# Patient Record
Sex: Female | Born: 1955
Health system: Southern US, Community
[De-identification: ages and names within clinical notes are randomized; demographics above are authoritative.]

## PROBLEM LIST (undated history)

## (undated) DIAGNOSIS — M069 Rheumatoid arthritis, unspecified: Secondary | ICD-10-CM

## (undated) DIAGNOSIS — D509 Iron deficiency anemia, unspecified: Secondary | ICD-10-CM

## (undated) DIAGNOSIS — G5601 Carpal tunnel syndrome, right upper limb: Secondary | ICD-10-CM

## (undated) DIAGNOSIS — Z9289 Personal history of other medical treatment: Secondary | ICD-10-CM

## (undated) HISTORY — PX: TONSILLECTOMY: SUR1361

## (undated) HISTORY — PX: UPPER GI ENDOSCOPY: SHX6162

## (undated) HISTORY — PX: COLONOSCOPY: SHX174

## (undated) HISTORY — PX: TOTAL KNEE ARTHROPLASTY: SHX125

---

## 1990-04-25 HISTORY — PX: CARPAL TUNNEL RELEASE: SHX101

## 2019-02-24 HISTORY — PX: TOTAL HIP ARTHROPLASTY: SHX124

## 2020-01-01 ENCOUNTER — Ambulatory Visit (INDEPENDENT_AMBULATORY_CARE_PROVIDER_SITE_OTHER): Payer: 59

## 2020-01-01 ENCOUNTER — Ambulatory Visit: Payer: 59 | Admitting: Orthopaedic Surgery

## 2020-01-01 ENCOUNTER — Encounter: Payer: Self-pay | Admitting: Orthopaedic Surgery

## 2020-01-01 DIAGNOSIS — M25552 Pain in left hip: Secondary | ICD-10-CM

## 2020-01-01 DIAGNOSIS — M1612 Unilateral primary osteoarthritis, left hip: Secondary | ICD-10-CM

## 2020-01-01 NOTE — Progress Notes (Signed)
Office Visit Note   Patient: [name]           Date of Birth: 03/31/1956           MRN: [id number] Visit Date: 01/01/2020              Requested by: No referring provider defined for this encounter. PCP: Patient, No Pcp Per   Assessment & Plan: Visit Diagnoses:  1. Pain in left hip   2. Unilateral primary osteoarthritis, left hip     Plan: I agree with her need for a left total hip arthroplasty given the severity of her pain and the severity of her arthritic findings of her left hip on plain films.  I explained in detail what hip replacement surgery involves.  She is fully aware of this having had this performed on her right hip.  I explained the difference between the anterior and posterior approach.  I gave her handout about the surgery.  We had a long thorough discussion about her interoperative and postoperative course as well as the risk and benefits of surgery.  We will work on getting this scheduled in the near future.  All questions and concerns were answered and addressed.  Follow-Up Instructions: Return for 2 weeks post-op.   Orders:  Orders Placed This Encounter  Procedures  . XR HIP UNILAT W OR W/O PELVIS 2-3 VIEWS LEFT   No orders of the defined types were placed in this encounter.     Procedures: No procedures performed   Clinical Data: No additional findings.   Subjective: Chief Complaint  Patient presents with  . Left Hip - Pain  Patient comes in today with worsening left hip pain.  She actually was scheduled to undergo a hip replacement of her left hip in [place] but canceled that after moving to [place].  Her family is down here.  She does have a history of a right total hip arthroplasty done in [place] in August of this past year and she also has a right total knee arthroplasty.  Her left hip pain is debilitating.  It is daily.  It is 10 out of 10.  At this point her left hip pain is detrimentally affecting her mobility, her quality  of life and her actives daily living.  She wishes to go ahead and have her left hip replaced.  She has tried and failed conservative treatment for multiple years now including activity modification, anti-inflammatories, steroid injections, hip exercises and using an assistive device when she ambulates.  She denies any acute changes in her medical status.  HPI  Review of Systems She currently denies any headache, chest pain, shortness of breath, fever, chills, nausea, vomiting  Objective: Vital Signs: There were no vitals taken for this visit.  Physical Exam She is alert and orient x3 and in no acute distress.  She has a Trendelenburg gait favoring the left side. Ortho Exam Examination of her left hip shows essentially almost no internal and external rotation.  She has a leg length discrepancy with her left side shorter than left.  She has severe pain when I attempt to rotate her left hip. Specialty Comments:  No specialty comments available.  Imaging: XR HIP UNILAT W OR W/O PELVIS 2-3 VIEWS LEFT  Result Date: 01/01/2020 A low AP pelvis and lateral left hip shows severe end-stage arthritis with of her left hip.  There is essentially no joint space remaining.  There is superior migration of the femoral head.  There is significant cystic changes of the acetabulum and femoral head and flattening of the femoral head.  There are para-articular osteophytes as well.  There is a well-seated right total hip arthroplasty.    PMFS History: Patient Active Problem List   Diagnosis Date Noted  . Unilateral primary osteoarthritis, left hip 01/01/2020   History reviewed. No pertinent past medical history.  History reviewed. No pertinent family history.  History reviewed. No pertinent surgical history. Social History   Occupational History  . Not on file  Tobacco Use  . Smoking status: Not on file  Substance and Sexual Activity  . Alcohol use: Not on file  . Drug use: Not on file  . Sexual  activity: Not on file

## 2020-01-04 ENCOUNTER — Ambulatory Visit: Payer: Self-pay | Admitting: Family Medicine

## 2020-01-04 ENCOUNTER — Other Ambulatory Visit: Payer: Self-pay

## 2020-01-04 ENCOUNTER — Encounter: Payer: Self-pay | Admitting: Family Medicine

## 2020-01-04 ENCOUNTER — Ambulatory Visit: Payer: 59 | Admitting: Family Medicine

## 2020-01-04 DIAGNOSIS — M069 Rheumatoid arthritis, unspecified: Secondary | ICD-10-CM | POA: Diagnosis not present

## 2020-01-04 NOTE — Progress Notes (Signed)
Office Visit Note   Patient: [name]           Date of Birth: 1955/12/17           MRN: [id number] Visit Date: 01/04/2020 Requested by: No referring provider defined for this encounter. PCP: Patient, No Pcp Per  Subjective: No chief complaint on file.   HPI: She is here to establish care.  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  She has another daughter in [place] who works as a Designer, jewellery in a pain clinic.  Patient has a history of rheumatoid arthritis.  She is status post right knee and right hip replacement.  Her left hip has bothered her substantially, and she is scheduled for a hip replacement at the end of July.  From a rheumatoid arthritis standpoint she is on methotrexate.  She will be off medication for 1 month prior to surgery.  She has scheduled an appointment to establish with a rheumatologist in this area.  Her hands have been involved in the past, but are much less symptomatic now.  Her nurse practitioner daughter would like for her to establish with a cardiologist.  Patient is asymptomatic from a cardiology standpoint, her blood pressure has been normal, there is no family history of heart disease.  She is a former smoker more than 30 years ago.  She recently had labs drawn in [place] prior to moving here.  Everything was looking good per her report.  She is up-to-date on mammogram, colonoscopy and immunizations.                ROS:   All other systems were reviewed and are negative.  Objective: Vital Signs: BP 120/67 (BP Location: Left Arm, Patient Position: Sitting)   Pulse 82   Ht 5' 4.5" (1.638 m)   Wt 119 lb 9.6 oz (54.3 kg)   BMI 20.21 kg/m   Physical Exam:  General:  Alert and oriented, in no acute distress. Pulm:  Breathing unlabored. Psy:  Normal mood, congruent affect. Skin: No rash HEENT: No thyromegaly, no  carotid bruits. CV: Regular rate and rhythm without murmurs, rubs, or gallops.  No peripheral edema.  2+ radial and posterior tibial pulses. Lungs: Clear to auscultation throughout with no wheezing or areas of consolidation. Abd: Bowel sounds are active, no hepatosplenomegaly or masses.  Soft and nontender.  No audible bruits.  No evidence of ascites. Extremities: Very limited left hip range of motion.   Imaging: No results found.  Assessment & Plan: 1.  Visit to establish care -She does not need any medication refills right now.  She is taking adequate vitamins over-the-counter.  I will plan on seeing her back in about 6 months for routine visit with labs.  She will contact me prior to that if she needs anything. -She is cleared for upcoming hip replacement surgery.  2.  Rheumatoid arthritis -Getting ready to meet with rheumatologist in the near future. -After surgery, she would like to establish with a cardiologist for routine monitoring.     Procedures: No procedures performed  No notes on file     PMFS History: Patient Active Problem List   Diagnosis Date Noted  . Rheumatoid arthritis ([place]) 01/04/2020  . Unilateral primary osteoarthritis, left hip 01/01/2020   History reviewed. No pertinent past medical history.  Family History  Problem Relation Age of  Onset  . Liver cancer Mother   . Cancer Mother   . Rheumatologic disease Mother   . Prostate cancer Father        Asbestos exposure  . Cancer Father   . Colon cancer Father   . Breast cancer Neg Hx   . Heart disease Neg Hx     History reviewed. No pertinent surgical history. Social History   Occupational History  . Not on file  Tobacco Use  . Smoking status: Former Games developer  . Smokeless tobacco: Never Used  Substance and Sexual Activity  . Alcohol use: Not on file  . Drug use: Not on file  . Sexual activity: Not on file

## 2020-01-09 ENCOUNTER — Other Ambulatory Visit: Payer: Self-pay

## 2020-02-07 ENCOUNTER — Encounter (HOSPITAL_COMMUNITY): Payer: Self-pay

## 2020-02-07 NOTE — Patient Instructions (Addendum)
DUE TO COVID-19 ONLY ONE VISITOR ARE ALLOWED TO COME WITH YOU AND STAY IN THE WAITING ROOM ONLY DURING PRE OP AND PROCEDURE. THEN TWO VISITORS MAY VISIT WITH YOU IN YOUR PRIVATE ROOM DURING VISITING HOURS ONLY!! (10AM-8PM)   COVID SWAB TESTING MUST BE COMPLETED ON: Tuesday, February 19, 2020   at 10:15 AM  [address] [place] [facility] enter pre surgical testing line (Must self quarantine after testing. Follow instructions on handout.)             Your procedure is scheduled on: Friday, February 22, 2020   Report to [facility] Main  Entrance    Report to admitting at 7:15 AM   Call this number if you have problems the morning of surgery [phone]   Do not eat food :After Midnight.   May have liquids until  6:45 AM  day of surgery   CLEAR LIQUID DIET  Foods Allowed                                                                     Foods Excluded  Water, Black Coffee and tea, regular and decaf                             liquids that you cannot  Plain Jell-O in any flavor  (No red)                                           see through such as: Fruit ices (not with fruit pulp)                                     milk, soups, orange juice  Iced Popsicles (No red)                                    All solid food                                   Apple juices Sports drinks like Gatorade (No red) Lightly seasoned clear broth or consume(fat free) Sugar, honey syrup  Sample Menu Breakfast                                Lunch                                     Supper Cranberry juice                    Beef broth                            Chicken broth Jell-O  Grape juice                           Apple juice Coffee or tea                        Jell-O                                      Popsicle                                                Coffee or tea                        Coffee or tea   Oral Hygiene is  also important to reduce your risk of infection.                                    Remember - BRUSH YOUR TEETH THE MORNING OF SURGERY WITH YOUR REGULAR TOOTHPASTE   Do NOT smoke after Midnight   Take these medicines the morning of surgery with A SIP OF WATER: NONE                               You may not have any metal on your body including hair pins, jewelry, and body piercings             Do not wear make-up, lotions, powders, perfumes/cologne, or deodorant             Do not wear nail polish.  Do not shave  48 hours prior to surgery.              Do not bring valuables to the hospital. [facility] IS NOT             RESPONSIBLE   FOR VALUABLES.   Contacts, dentures or bridgework may not be worn into surgery.   Bring small overnight bag day of surgery.    Special Instructions: Bring a copy of your healthcare power of attorney and living will documents         the day of surgery if you haven't scanned them in before.              Please read over the following fact sheets you were given: IF YOU HAVE QUESTIONS ABOUT YOUR PRE OP INSTRUCTIONS PLEASE CALL [phone]   [facility] - Preparing for Surgery Before surgery, you can play an important role.  Because skin is not sterile, your skin needs to be as free of germs as possible.  You can reduce the number of germs on your skin by washing with CHG (chlorahexidine gluconate) soap before surgery.  CHG is an antiseptic cleaner which kills germs and bonds with the skin to continue killing germs even after washing. Please DO NOT use if you have an allergy to CHG or antibacterial soaps.  If your skin becomes reddened/irritated stop using the CHG and inform your nurse when you arrive at Short Stay. Do not shave (including legs and underarms) for at  least 48 hours prior to the first CHG shower.  You may shave your face/neck.  Please follow these instructions carefully:  1.  Shower with CHG Soap the night before surgery and the  morning of  surgery.  2.  If you choose to wash your hair, wash your hair first as usual with your normal  shampoo.  3.  After you shampoo, rinse your hair and body thoroughly to remove the shampoo.                             4.  Use CHG as you would any other liquid soap.  You can apply chg directly to the skin and wash.  Gently with a scrungie or clean washcloth.  5.  Apply the CHG Soap to your body ONLY FROM THE NECK DOWN.   Do   not use on face/ open                           Wound or open sores. Avoid contact with eyes, ears mouth and   genitals (private parts).                       Wash face,  Genitals (private parts) with your normal soap.             6.  Wash thoroughly, paying special attention to the area where your    surgery  will be performed.  7.  Thoroughly rinse your body with warm water from the neck down.  8.  DO NOT shower/wash with your normal soap after using and rinsing off the CHG Soap.                9.  Pat yourself dry with a clean towel.            10.  Wear clean pajamas.            11.  Place clean sheets on your bed the night of your first shower and do not  sleep with pets. Day of Surgery : Do not apply any lotions/deodorants the morning of surgery.  Please wear clean clothes to the hospital/surgery center.  FAILURE TO FOLLOW THESE INSTRUCTIONS MAY RESULT IN THE CANCELLATION OF YOUR SURGERY  PATIENT SIGNATURE_________________________________  NURSE SIGNATURE__________________________________  ________________________________________________________________________

## 2020-02-07 NOTE — Progress Notes (Addendum)
COVID Vaccine Completed: Yes Date COVID Vaccine completed: 09/11/2019 COVID vaccine manufacturer:    Moderna      PCP - Dr. Prince Rome last office visit 01/04/2020 in epic Cardiologist - N.A  Chest x-ray [facility] EKG [facility] Stress Test - N/A ECHO - N/A Cardiac Cath - N/A  Sleep Study - N/A CPAP - N/A  Fasting Blood Sugar - N/A Checks Blood Sugar __N/A___ times a day  Blood Thinner Instructions: N/A Aspirin Instructions: N/A Last Dose: N/A  Anesthesia review:  N/A  Patient denies shortness of breath, fever, cough and chest pain at PAT appointment   Patient verbalized understanding of instructions that were given to them at the PAT appointment. Patient was also instructed that they will need to review over the PAT instructions again at home before surgery.

## 2020-02-12 ENCOUNTER — Other Ambulatory Visit: Payer: Self-pay

## 2020-02-12 ENCOUNTER — Other Ambulatory Visit: Payer: Self-pay | Admitting: Physician Assistant

## 2020-02-12 ENCOUNTER — Encounter (HOSPITAL_COMMUNITY): Payer: Self-pay

## 2020-02-12 ENCOUNTER — Encounter (HOSPITAL_COMMUNITY)
Admission: RE | Admit: 2020-02-12 | Discharge: 2020-02-12 | Disposition: A | Discharge status: Home / Self Care | Payer: 59 | Source: Ambulatory Visit | Attending: Orthopaedic Surgery | Admitting: Orthopaedic Surgery

## 2020-02-12 DIAGNOSIS — Z01812 Encounter for preprocedural laboratory examination: Secondary | ICD-10-CM | POA: Diagnosis present

## 2020-02-12 DIAGNOSIS — Z20822 Contact with and (suspected) exposure to covid-19: Secondary | ICD-10-CM | POA: Diagnosis not present

## 2020-02-12 HISTORY — DX: Iron deficiency anemia, unspecified: D50.9

## 2020-02-12 HISTORY — DX: Personal history of other medical treatment: Z92.89

## 2020-02-12 HISTORY — DX: Rheumatoid arthritis, unspecified: M06.9

## 2020-02-12 HISTORY — DX: Carpal tunnel syndrome, right upper limb: G56.01

## 2020-02-12 LAB — CBC
HCT: 40.1 % (ref 36.0–46.0)
Hemoglobin: 13 g/dL (ref 12.0–15.0)
MCH: 30.9 pg (ref 26.0–34.0)
MCHC: 32.4 g/dL (ref 30.0–36.0)
MCV: 95.2 fL (ref 80.0–100.0)
Platelets: 210 10*3/uL (ref 150–400)
RBC: 4.21 MIL/uL (ref 3.87–5.11)
RDW: 13.5 % (ref 11.5–15.5)
WBC: 6.4 10*3/uL (ref 4.0–10.5)
nRBC: 0 % (ref 0.0–0.2)

## 2020-02-12 LAB — SURGICAL PCR SCREEN
MRSA, PCR: NEGATIVE
Staphylococcus aureus: NEGATIVE

## 2020-02-19 ENCOUNTER — Other Ambulatory Visit (HOSPITAL_COMMUNITY)
Admission: RE | Admit: 2020-02-19 | Discharge: 2020-02-19 | Disposition: A | Discharge status: Home / Self Care | Payer: 59 | Source: Ambulatory Visit | Attending: Orthopaedic Surgery | Admitting: Orthopaedic Surgery

## 2020-02-19 DIAGNOSIS — Z20822 Contact with and (suspected) exposure to covid-19: Secondary | ICD-10-CM | POA: Diagnosis not present

## 2020-02-19 DIAGNOSIS — Z01812 Encounter for preprocedural laboratory examination: Secondary | ICD-10-CM | POA: Insufficient documentation

## 2020-02-19 LAB — SARS CORONAVIRUS 2 (TAT 6-24 HRS): SARS Coronavirus 2: NEGATIVE

## 2020-02-21 NOTE — H&P (Signed)
TOTAL HIP ADMISSION H&P  Patient is admitted for left total hip arthroplasty.  Subjective:  Chief Complaint: left hip pain  HPI: [name], 64 y.o. female, has a history of pain and functional disability in the left hip(s) due to arthritis and patient has failed non-surgical conservative treatments for greater than 12 weeks to include NSAID's and/or analgesics, corticosteriod injections, flexibility and strengthening excercises, supervised PT with diminished ADL's post treatment, use of assistive devices and activity modification.  Onset of symptoms was gradual starting 2 years ago with gradually worsening course since that time.The patient noted no past surgery on the left hip(s).  Patient currently rates pain in the left hip at 9 out of 10 with activity. Patient has night pain, worsening of pain with activity and weight bearing, trendelenberg gait, pain that interfers with activities of daily living, pain with passive range of motion and crepitus. Patient has evidence of subchondral cysts, subchondral sclerosis, periarticular osteophytes and joint space narrowing by imaging studies. This condition presents safety issues increasing the risk of falls.  There is no current active infection.  Patient Active Problem List   Diagnosis Date Noted  . Rheumatoid arthritis (HCC) 01/04/2020  . Unilateral primary osteoarthritis, left hip 01/01/2020   Past Medical History:  Diagnosis Date  . Carpal tunnel syndrome, right    History of  . History of blood transfusion    12 years ago  . Iron deficiency anemia   . RA (rheumatoid arthritis) (HCC)     Past Surgical History:  Procedure Laterality Date  . CESAREAN SECTION     x2  . COLONOSCOPY    . TONSILLECTOMY    . TOTAL HIP ARTHROPLASTY Right 02/2019  . TOTAL KNEE ARTHROPLASTY Right   . UPPER GI ENDOSCOPY      No current facility-administered medications for this encounter.   Current Outpatient Medications  Medication Sig Dispense Refill  Last Dose  . ascorbic acid (VITAMIN C) 500 MG tablet Take 500 mg by mouth daily.     . cholecalciferol (VITAMIN D3) 25 MCG (1000 UNIT) tablet Take 1,000 Units by mouth daily.     . folic acid (FOLVITE) 1 MG tablet Take 1 mg by mouth daily.     . methotrexate 2.5 MG tablet Take 5 mg by mouth 2 (two) times a week.     . Methotrexate 2.5 MG/ML SOLN Take 2.5 mg by mouth. 2 tablets on Sunday night, and 2 tablets on Monday (Patient not taking: Reported on 02/01/2020)   Not Taking at Unknown time   No Known Allergies  Social History   Tobacco Use  . Smoking status: Former Games developer  . Smokeless tobacco: Never Used  Substance Use Topics  . Alcohol use: Never    Family History  Problem Relation Age of Onset  . Liver cancer Mother   . Cancer Mother   . Rheumatologic disease Mother   . Prostate cancer Father        Asbestos exposure  . Cancer Father   . Colon cancer Father   . Breast cancer Neg Hx   . Heart disease Neg Hx      Review of Systems  Musculoskeletal: Positive for gait problem and joint swelling.  All other systems reviewed and are negative.   Objective:  Physical Exam Vitals reviewed.  Constitutional:      Appearance: Normal appearance.  HENT:     Head: Normocephalic and atraumatic.  Eyes:     Extraocular Movements: Extraocular movements intact.  Pupils: Pupils are equal, round, and reactive to light.  Cardiovascular:     Rate and Rhythm: Normal rate.     Pulses: Normal pulses.  Pulmonary:     Effort: Pulmonary effort is normal.  Abdominal:     Palpations: Abdomen is soft.  Musculoskeletal:     Cervical back: Normal range of motion and neck supple.     Left hip: Tenderness and bony tenderness present. Decreased range of motion. Decreased strength.  Neurological:     Mental Status: She is alert and oriented to person, place, and time.  Psychiatric:        Behavior: Behavior normal.     Vital signs in last 24 hours:    Labs:   Estimated body mass index  is 20.08 kg/m as calculated from the following:   Height as of 02/12/20: 5\' 4"  (1.626 m).   Weight as of 02/12/20: 53.1 kg.   Imaging Review Plain radiographs demonstrate severe degenerative joint disease of the left hip(s). The bone quality appears to be good for age and reported activity level.      Assessment/Plan:  End stage arthritis, left hip(s)  The patient history, physical examination, clinical judgement of the provider and imaging studies are consistent with end stage degenerative joint disease of the left hip(s) and total hip arthroplasty is deemed medically necessary. The treatment options including medical management, injection therapy, arthroscopy and arthroplasty were discussed at length. The risks and benefits of total hip arthroplasty were presented and reviewed. The risks due to aseptic loosening, infection, stiffness, dislocation/subluxation,  thromboembolic complications and other imponderables were discussed.  The patient acknowledged the explanation, agreed to proceed with the plan and consent was signed. Patient is being admitted for inpatient treatment for surgery, pain control, PT, OT, prophylactic antibiotics, VTE prophylaxis, progressive ambulation and ADL's and discharge planning.The patient is planning to be discharged home with home health services

## 2020-02-22 ENCOUNTER — Encounter (HOSPITAL_COMMUNITY): Payer: Self-pay | Admitting: Orthopaedic Surgery

## 2020-02-22 ENCOUNTER — Encounter (HOSPITAL_COMMUNITY)
Admission: RE | Disposition: A | Discharge status: Home / Self Care | Payer: Self-pay | Source: Home / Self Care | Attending: Orthopaedic Surgery

## 2020-02-22 ENCOUNTER — Observation Stay (HOSPITAL_COMMUNITY)
Admission: RE | Admit: 2020-02-22 | Discharge: 2020-02-23 | Disposition: A | Discharge status: Home / Self Care | Payer: 59 | Attending: Orthopaedic Surgery | Admitting: Orthopaedic Surgery

## 2020-02-22 ENCOUNTER — Other Ambulatory Visit: Payer: Self-pay

## 2020-02-22 ENCOUNTER — Ambulatory Visit (HOSPITAL_COMMUNITY): Payer: 59 | Admitting: Registered Nurse

## 2020-02-22 ENCOUNTER — Ambulatory Visit (HOSPITAL_COMMUNITY): Payer: 59

## 2020-02-22 ENCOUNTER — Observation Stay (HOSPITAL_COMMUNITY): Payer: 59

## 2020-02-22 DIAGNOSIS — M25552 Pain in left hip: Secondary | ICD-10-CM | POA: Diagnosis present

## 2020-02-22 DIAGNOSIS — M1612 Unilateral primary osteoarthritis, left hip: Secondary | ICD-10-CM | POA: Diagnosis not present

## 2020-02-22 DIAGNOSIS — Z79899 Other long term (current) drug therapy: Secondary | ICD-10-CM | POA: Diagnosis not present

## 2020-02-22 DIAGNOSIS — Z96642 Presence of left artificial hip joint: Secondary | ICD-10-CM | POA: Diagnosis not present

## 2020-02-22 DIAGNOSIS — Z419 Encounter for procedure for purposes other than remedying health state, unspecified: Secondary | ICD-10-CM

## 2020-02-22 DIAGNOSIS — Z87891 Personal history of nicotine dependence: Secondary | ICD-10-CM | POA: Insufficient documentation

## 2020-02-22 DIAGNOSIS — M06052 Rheumatoid arthritis without rheumatoid factor, left hip: Secondary | ICD-10-CM | POA: Diagnosis not present

## 2020-02-22 HISTORY — PX: TOTAL HIP ARTHROPLASTY: SHX124

## 2020-02-22 LAB — ABO/RH: ABO/RH(D): O POS

## 2020-02-22 LAB — TYPE AND SCREEN
ABO/RH(D): O POS
Antibody Screen: NEGATIVE

## 2020-02-22 SURGERY — ARTHROPLASTY, HIP, TOTAL, ANTERIOR APPROACH
Anesthesia: Spinal | Site: Hip | Laterality: Left

## 2020-02-22 MED ORDER — POVIDONE-IODINE 10 % EX SWAB
2.0000 "application " | Freq: Once | CUTANEOUS | Status: AC
  Administered 2020-02-22: 2 via TOPICAL

## 2020-02-22 MED ORDER — TRAMADOL HCL 50 MG PO TABS
50.0000 mg | ORAL_TABLET | Freq: Four times a day (QID) | ORAL | Status: DC
  Administered 2020-02-22 – 2020-02-23 (×4): 50 mg via ORAL
  Filled 2020-02-22 (×4): qty 1

## 2020-02-22 MED ORDER — DEXAMETHASONE SODIUM PHOSPHATE 10 MG/ML IJ SOLN
INTRAMUSCULAR | Status: AC
  Filled 2020-02-22: qty 1

## 2020-02-22 MED ORDER — ACETAMINOPHEN 325 MG PO TABS: 325.0000 mg | ORAL_TABLET | Freq: Four times a day (QID) | ORAL | Status: DC | PRN

## 2020-02-22 MED ORDER — PROMETHAZINE HCL 25 MG/ML IJ SOLN: 6.2500 mg | INTRAMUSCULAR | Status: DC | PRN

## 2020-02-22 MED ORDER — SODIUM CHLORIDE 0.9 % IV SOLN: INTRAVENOUS | Status: DC

## 2020-02-22 MED ORDER — METHOCARBAMOL 500 MG PO TABS
500.0000 mg | ORAL_TABLET | Freq: Four times a day (QID) | ORAL | Status: DC | PRN
  Administered 2020-02-23: 500 mg via ORAL
  Filled 2020-02-22: qty 1

## 2020-02-22 MED ORDER — ASPIRIN 81 MG PO CHEW
81.0000 mg | CHEWABLE_TABLET | Freq: Two times a day (BID) | ORAL | Status: DC
  Administered 2020-02-22 – 2020-02-23 (×2): 81 mg via ORAL
  Filled 2020-02-22 (×2): qty 1

## 2020-02-22 MED ORDER — MORPHINE SULFATE (PF) 2 MG/ML IV SOLN: 0.5000 mg | INTRAVENOUS | Status: DC | PRN

## 2020-02-22 MED ORDER — ONDANSETRON HCL 4 MG PO TABS: 4.0000 mg | ORAL_TABLET | Freq: Four times a day (QID) | ORAL | Status: DC | PRN

## 2020-02-22 MED ORDER — METHOCARBAMOL 500 MG IVPB - SIMPLE MED
500.0000 mg | Freq: Four times a day (QID) | INTRAVENOUS | Status: DC | PRN
  Administered 2020-02-22: 500 mg via INTRAVENOUS
  Filled 2020-02-22: qty 500
  Filled 2020-02-22: qty 50

## 2020-02-22 MED ORDER — ACETAMINOPHEN 325 MG PO TABS: 325.0000 mg | ORAL_TABLET | Freq: Once | ORAL | Status: DC | PRN

## 2020-02-22 MED ORDER — FOLIC ACID 1 MG PO TABS
1.0000 mg | ORAL_TABLET | Freq: Every day | ORAL | Status: DC
  Administered 2020-02-22 – 2020-02-23 (×2): 1 mg via ORAL
  Filled 2020-02-22 (×2): qty 1

## 2020-02-22 MED ORDER — ORAL CARE MOUTH RINSE: 15.0000 mL | Freq: Once | OROMUCOSAL | Status: AC

## 2020-02-22 MED ORDER — DOCUSATE SODIUM 100 MG PO CAPS
100.0000 mg | ORAL_CAPSULE | Freq: Two times a day (BID) | ORAL | Status: DC
  Administered 2020-02-22 – 2020-02-23 (×2): 100 mg via ORAL
  Filled 2020-02-22 (×2): qty 1

## 2020-02-22 MED ORDER — CHLORHEXIDINE GLUCONATE 0.12 % MT SOLN
15.0000 mL | Freq: Once | OROMUCOSAL | Status: AC
  Administered 2020-02-22: 15 mL via OROMUCOSAL

## 2020-02-22 MED ORDER — 0.9 % SODIUM CHLORIDE (POUR BTL) OPTIME
TOPICAL | Status: DC | PRN
  Administered 2020-02-22: 1000 mL

## 2020-02-22 MED ORDER — DIPHENHYDRAMINE HCL 12.5 MG/5ML PO ELIX: 12.5000 mg | ORAL_SOLUTION | ORAL | Status: DC | PRN

## 2020-02-22 MED ORDER — PROPOFOL 1000 MG/100ML IV EMUL
INTRAVENOUS | Status: AC
  Filled 2020-02-22: qty 100

## 2020-02-22 MED ORDER — MENTHOL 3 MG MT LOZG: 1.0000 | LOZENGE | OROMUCOSAL | Status: DC | PRN

## 2020-02-22 MED ORDER — FENTANYL CITRATE (PF) 100 MCG/2ML IJ SOLN
INTRAMUSCULAR | Status: DC | PRN
  Administered 2020-02-22 (×2): 50 ug via INTRAVENOUS

## 2020-02-22 MED ORDER — POLYETHYLENE GLYCOL 3350 17 G PO PACK: 17.0000 g | PACK | Freq: Every day | ORAL | Status: DC | PRN

## 2020-02-22 MED ORDER — MIDAZOLAM HCL 2 MG/2ML IJ SOLN
INTRAMUSCULAR | Status: AC
  Filled 2020-02-22: qty 2

## 2020-02-22 MED ORDER — MEPERIDINE HCL 50 MG/ML IJ SOLN: 6.2500 mg | INTRAMUSCULAR | Status: DC | PRN

## 2020-02-22 MED ORDER — METOCLOPRAMIDE HCL 5 MG/ML IJ SOLN: 5.0000 mg | Freq: Three times a day (TID) | INTRAMUSCULAR | Status: DC | PRN

## 2020-02-22 MED ORDER — FENTANYL CITRATE (PF) 100 MCG/2ML IJ SOLN
INTRAMUSCULAR | Status: AC
  Filled 2020-02-22: qty 2

## 2020-02-22 MED ORDER — DEXAMETHASONE SODIUM PHOSPHATE 10 MG/ML IJ SOLN
INTRAMUSCULAR | Status: DC | PRN
  Administered 2020-02-22: 6 mg via INTRAVENOUS

## 2020-02-22 MED ORDER — TRANEXAMIC ACID-NACL 1000-0.7 MG/100ML-% IV SOLN
1000.0000 mg | INTRAVENOUS | Status: AC
  Administered 2020-02-22: 1000 mg via INTRAVENOUS
  Filled 2020-02-22: qty 100

## 2020-02-22 MED ORDER — STERILE WATER FOR IRRIGATION IR SOLN
Status: DC | PRN
  Administered 2020-02-22: 2000 mL

## 2020-02-22 MED ORDER — VITAMIN D 25 MCG (1000 UNIT) PO TABS
1000.0000 [IU] | ORAL_TABLET | Freq: Every day | ORAL | Status: DC
  Administered 2020-02-22 – 2020-02-23 (×2): 1000 [IU] via ORAL
  Filled 2020-02-22 (×2): qty 1

## 2020-02-22 MED ORDER — METOCLOPRAMIDE HCL 5 MG PO TABS: 5.0000 mg | ORAL_TABLET | Freq: Three times a day (TID) | ORAL | Status: DC | PRN

## 2020-02-22 MED ORDER — ASCORBIC ACID 500 MG PO TABS
500.0000 mg | ORAL_TABLET | Freq: Every day | ORAL | Status: DC
  Administered 2020-02-22 – 2020-02-23 (×2): 500 mg via ORAL
  Filled 2020-02-22 (×2): qty 1

## 2020-02-22 MED ORDER — SODIUM CHLORIDE 0.9 % IR SOLN
Status: DC | PRN
  Administered 2020-02-22: 1000 mL

## 2020-02-22 MED ORDER — CEFAZOLIN SODIUM-DEXTROSE 1-4 GM/50ML-% IV SOLN
1.0000 g | Freq: Four times a day (QID) | INTRAVENOUS | Status: AC
  Administered 2020-02-22 (×2): 1 g via INTRAVENOUS
  Filled 2020-02-22 (×2): qty 50

## 2020-02-22 MED ORDER — HYDROMORPHONE HCL 1 MG/ML IJ SOLN: 0.2500 mg | INTRAMUSCULAR | Status: DC | PRN

## 2020-02-22 MED ORDER — HYDROCODONE-ACETAMINOPHEN 7.5-325 MG PO TABS: 1.0000 | ORAL_TABLET | ORAL | Status: DC | PRN

## 2020-02-22 MED ORDER — CEFAZOLIN SODIUM-DEXTROSE 2-4 GM/100ML-% IV SOLN
2.0000 g | INTRAVENOUS | Status: AC
  Administered 2020-02-22: 2 g via INTRAVENOUS
  Filled 2020-02-22: qty 100

## 2020-02-22 MED ORDER — BUPIVACAINE IN DEXTROSE 0.75-8.25 % IT SOLN
INTRATHECAL | Status: DC | PRN
Start: 2020-02-22 — End: 2020-02-22
  Administered 2020-02-22: 1.6 mL via INTRATHECAL

## 2020-02-22 MED ORDER — ALUM & MAG HYDROXIDE-SIMETH 200-200-20 MG/5ML PO SUSP: 30.0000 mL | ORAL | Status: DC | PRN

## 2020-02-22 MED ORDER — MIDAZOLAM HCL 5 MG/5ML IJ SOLN
INTRAMUSCULAR | Status: DC | PRN
  Administered 2020-02-22: 2 mg via INTRAVENOUS

## 2020-02-22 MED ORDER — MORPHINE SULFATE (PF) 4 MG/ML IV SOLN
0.5000 mg | INTRAVENOUS | Status: DC | PRN
  Administered 2020-02-22: 1 mg via INTRAVENOUS
  Filled 2020-02-22: qty 1

## 2020-02-22 MED ORDER — ACETAMINOPHEN 10 MG/ML IV SOLN: 1000.0000 mg | Freq: Once | INTRAVENOUS | Status: DC | PRN

## 2020-02-22 MED ORDER — LACTATED RINGERS IV SOLN: INTRAVENOUS | Status: DC

## 2020-02-22 MED ORDER — ONDANSETRON HCL 4 MG/2ML IJ SOLN
INTRAMUSCULAR | Status: AC
  Filled 2020-02-22: qty 2

## 2020-02-22 MED ORDER — PROPOFOL 500 MG/50ML IV EMUL
INTRAVENOUS | Status: DC | PRN
  Administered 2020-02-22: 75 ug/kg/min via INTRAVENOUS

## 2020-02-22 MED ORDER — PANTOPRAZOLE SODIUM 40 MG PO TBEC
40.0000 mg | DELAYED_RELEASE_TABLET | Freq: Every day | ORAL | Status: DC
  Administered 2020-02-22 – 2020-02-23 (×2): 40 mg via ORAL
  Filled 2020-02-22 (×2): qty 1

## 2020-02-22 MED ORDER — PHENYLEPHRINE HCL (PRESSORS) 10 MG/ML IV SOLN
INTRAVENOUS | Status: AC
  Filled 2020-02-22: qty 1

## 2020-02-22 MED ORDER — PHENOL 1.4 % MT LIQD: 1.0000 | OROMUCOSAL | Status: DC | PRN

## 2020-02-22 MED ORDER — ONDANSETRON HCL 4 MG/2ML IJ SOLN
INTRAMUSCULAR | Status: DC | PRN
  Administered 2020-02-22: 4 mg via INTRAVENOUS

## 2020-02-22 MED ORDER — ACETAMINOPHEN 160 MG/5ML PO SOLN: 325.0000 mg | Freq: Once | ORAL | Status: DC | PRN

## 2020-02-22 MED ORDER — PROPOFOL 10 MG/ML IV BOLUS
INTRAVENOUS | Status: AC
  Filled 2020-02-22: qty 20

## 2020-02-22 MED ORDER — ONDANSETRON HCL 4 MG/2ML IJ SOLN: 4.0000 mg | Freq: Four times a day (QID) | INTRAMUSCULAR | Status: DC | PRN

## 2020-02-22 MED ORDER — PHENYLEPHRINE HCL-NACL 10-0.9 MG/250ML-% IV SOLN
INTRAVENOUS | Status: DC | PRN
Start: 2020-02-22 — End: 2020-02-22
  Administered 2020-02-22: 35 ug/min via INTRAVENOUS

## 2020-02-22 MED ORDER — HYDROCODONE-ACETAMINOPHEN 5-325 MG PO TABS
1.0000 | ORAL_TABLET | ORAL | Status: DC | PRN
  Administered 2020-02-22: 2 via ORAL
  Filled 2020-02-22: qty 2

## 2020-02-22 MED ORDER — PROPOFOL 10 MG/ML IV BOLUS
INTRAVENOUS | Status: DC | PRN
  Administered 2020-02-22: 20 mg via INTRAVENOUS
  Administered 2020-02-22: 30 mg via INTRAVENOUS

## 2020-02-22 SURGICAL SUPPLY — 43 items
BAG ZIPLOCK 12X15 (MISCELLANEOUS) IMPLANT
BENZOIN TINCTURE PRP APPL 2/3 (GAUZE/BANDAGES/DRESSINGS) IMPLANT
BLADE SAW SGTL 18X1.27X75 (BLADE) ×2 IMPLANT
BLADE SAW SGTL 18X1.27X75MM (BLADE) ×1
CLOSURE WOUND 1/2 X4 (GAUZE/BANDAGES/DRESSINGS)
COLLAR OFFSET CORAIL SZ 12 HIP (Stem) ×1 IMPLANT
CORAIL OFFSET COLLAR SZ 12 HIP (Stem) ×3 IMPLANT
COVER PERINEAL POST (MISCELLANEOUS) ×3 IMPLANT
COVER SURGICAL LIGHT HANDLE (MISCELLANEOUS) ×3 IMPLANT
COVER WAND RF STERILE (DRAPES) ×3 IMPLANT
DRAPE STERI IOBAN 125X83 (DRAPES) ×3 IMPLANT
DRAPE U-SHAPE 47X51 STRL (DRAPES) ×6 IMPLANT
DRSG AQUACEL AG ADV 3.5X10 (GAUZE/BANDAGES/DRESSINGS) ×3 IMPLANT
DURAPREP 26ML APPLICATOR (WOUND CARE) ×3 IMPLANT
ELECT REM PT RETURN 15FT ADLT (MISCELLANEOUS) ×3 IMPLANT
GAUZE XEROFORM 1X8 LF (GAUZE/BANDAGES/DRESSINGS) ×3 IMPLANT
GLOVE BIO SURGEON STRL SZ7.5 (GLOVE) ×3 IMPLANT
GLOVE BIOGEL PI IND STRL 8 (GLOVE) ×2 IMPLANT
GLOVE BIOGEL PI INDICATOR 8 (GLOVE) ×4
GLOVE ECLIPSE 8.0 STRL XLNG CF (GLOVE) ×3 IMPLANT
GOWN STRL REUS W/TWL XL LVL3 (GOWN DISPOSABLE) ×6 IMPLANT
HANDPIECE INTERPULSE COAX TIP (DISPOSABLE) ×2
HEAD M SROM 36MM PLUS 1.5 (Hips) ×1 IMPLANT
HOLDER FOLEY CATH W/STRAP (MISCELLANEOUS) ×3 IMPLANT
KIT TURNOVER KIT A (KITS) IMPLANT
LINER ACETAB NEUTRAL 36ID 520D (Liner) ×3 IMPLANT
PACK ANTERIOR HIP CUSTOM (KITS) ×3 IMPLANT
PENCIL SMOKE EVACUATOR (MISCELLANEOUS) ×3 IMPLANT
PIN SECTOR W/GRIP ACE CUP 52MM (Hips) ×3 IMPLANT
SCREW 6.5MMX25MM (Screw) ×3 IMPLANT
SET HNDPC FAN SPRY TIP SCT (DISPOSABLE) ×1 IMPLANT
SROM M HEAD 36MM PLUS 1.5 (Hips) ×3 IMPLANT
STAPLER VISISTAT 35W (STAPLE) IMPLANT
STRIP CLOSURE SKIN 1/2X4 (GAUZE/BANDAGES/DRESSINGS) IMPLANT
SUT ETHIBOND NAB CT1 #1 30IN (SUTURE) ×3 IMPLANT
SUT ETHILON 2 0 PS N (SUTURE) IMPLANT
SUT MNCRL AB 4-0 PS2 18 (SUTURE) IMPLANT
SUT VIC AB 0 CT1 36 (SUTURE) ×3 IMPLANT
SUT VIC AB 1 CT1 36 (SUTURE) ×3 IMPLANT
SUT VIC AB 2-0 CT1 27 (SUTURE) ×4
SUT VIC AB 2-0 CT1 TAPERPNT 27 (SUTURE) ×2 IMPLANT
TRAY FOLEY MTR SLVR 14FR STAT (SET/KITS/TRAYS/PACK) ×3 IMPLANT
YANKAUER SUCT BULB TIP 10FT TU (MISCELLANEOUS) ×3 IMPLANT

## 2020-02-22 NOTE — Transfer of Care (Signed)
Immediate Anesthesia Transfer of Care Note  Patient: [name]  Procedure(s) Performed: LEFT TOTAL HIP ARTHROPLASTY ANTERIOR APPROACH (Left Hip)  Patient Location: PACU  Anesthesia Type:Spinal  Level of Consciousness: awake, alert , oriented and patient cooperative  Airway & Oxygen Therapy: Patient Spontanous Breathing and Patient connected to face mask oxygen  Post-op Assessment: Report given to RN and Post -op Vital signs reviewed and stable  Post vital signs: stable  Last Vitals:  Vitals Value Taken Time  BP    Temp    Pulse    Resp    SpO2      Last Pain:  Vitals:   02/22/20 0759  TempSrc:   PainSc: 0-No pain         Complications: No complications documented.

## 2020-02-22 NOTE — Evaluation (Signed)
Physical Therapy Evaluation Patient Details Name: [name] MRN: [id number] DOB: Jul 22, 1956 Today's Date: 02/22/2020   History of Present Illness  Patient is 64 y.o. female s/p Lt THA anterior approach on 02/22/20 with PMH significant for RA, anemia, Rt TKA in 2020.  Clinical Impression  [name] is a 64 y.o. female POD 0 s/p Lt THA. Patient reports independence with mobility at baseline. Patient is now limited by functional impairments (see PT problem list below) and requires min assist for bed mob and transfers with RW. Patient was limited by pain this date and c/o lightheadedness with standing. Pt returned to bed and educated on benefits of sitting EOB or in recliner with assist from RN staff to eat dinner later. Patient will benefit from continued skilled PT interventions to address impairments and progress towards PLOF. Acute PT will follow to progress mobility and stair training in preparation for safe discharge home.     Follow Up Recommendations Follow surgeon's recommendation for DC plan and follow-up therapies;Home health PT    Equipment Recommendations  None recommended by PT    Recommendations for Other Services       Precautions / Restrictions Precautions Precautions: Fall Restrictions Weight Bearing Restrictions: No Other Position/Activity Restrictions: WBAT      Mobility  Bed Mobility Overal bed mobility: Needs Assistance Bed Mobility: Supine to Sit;Sit to Supine     Supine to sit: Min assist;HOB elevated Sit to supine: Min assist;HOB elevated   General bed mobility comments: Cues for sequencing and use of bed rail. Assist to bring Lt LE off EOB. Assist to raise Lt LE into bed and reposition in supine.  Transfers Overall transfer level: Needs assistance Equipment used: Rolling walker (2 wheeled) Transfers: Sit to/from Stand Sit to Stand: Min assist         General transfer comment: VCs fr safe technique with RW, pt c/o lightheadedness in  standing and it did not subside, pt requested to return to sit and rest in bed.  Ambulation/Gait                Stairs            Wheelchair Mobility    Modified Rankin (Stroke Patients Only)       Balance Overall balance assessment: Needs assistance Sitting-balance support: Feet supported Sitting balance-Leahy Scale: Good     Standing balance support: During functional activity;Bilateral upper extremity supported Standing balance-Leahy Scale: Fair                Pertinent Vitals/Pain Pain Assessment: 0-10 Pain Score: 8  Pain Location: Lt hip Pain Descriptors / Indicators: Aching;Discomfort Pain Intervention(s): Limited activity within patient's tolerance;Monitored during session;Repositioned;Ice applied    Home Living Family/patient expects to be discharged to:: Private residence Living Arrangements: Spouse/significant other Available Help at Discharge: Family Type of Home: House Home Access: Stairs to enter Entrance Stairs-Rails: Can reach both Entrance Stairs-Number of Steps: 2+1 Home Layout: One level Home Equipment: Environmental consultant - 2 wheels;Cane - single point;Toilet riser;Shower seat - built in      Prior Function Level of Independence: Independent         Comments: tried using a SPC but felt it made her more unsteady.     Hand Dominance   Dominant Hand: Right    Extremity/Trunk Assessment   Upper Extremity Assessment Upper Extremity Assessment: Overall WFL for tasks assessed    Lower Extremity Assessment Lower Extremity Assessment: Overall WFL for tasks assessed;LLE deficits/detail LLE: Unable to fully assess due  to pain    Cervical / Trunk Assessment Cervical / Trunk Assessment: Normal  Communication   Communication: No difficulties  Cognition Arousal/Alertness: Awake/alert Behavior During Therapy: WFL for tasks assessed/performed Overall Cognitive Status: Within Functional Limits for tasks assessed           General  Comments      Exercises     Assessment/Plan    PT Assessment Patient needs continued PT services  PT Problem List Decreased strength;Decreased range of motion;Decreased activity tolerance;Decreased balance;Decreased mobility;Decreased knowledge of use of DME;Decreased knowledge of precautions;Pain       PT Treatment Interventions DME instruction;Gait training;Stair training;Functional mobility training;Therapeutic activities;Therapeutic exercise;Balance training;Patient/family education    PT Goals (Current goals can be found in the Care Plan section)  Acute Rehab PT Goals Patient Stated Goal: get back to walking and spending time with grandkids PT Goal Formulation: With patient Time For Goal Achievement: 02/29/20 Potential to Achieve Goals: Good    Frequency 7X/week    AM-PAC PT "6 Clicks" Mobility  Outcome Measure Help needed turning from your back to your side while in a flat bed without using bedrails?: A Little Help needed moving from lying on your back to sitting on the side of a flat bed without using bedrails?: A Little Help needed moving to and from a bed to a chair (including a wheelchair)?: A Little Help needed standing up from a chair using your arms (e.g., wheelchair or bedside chair)?: A Little Help needed to walk in hospital room?: A Little Help needed climbing 3-5 steps with a railing? : A Lot 6 Click Score: 17    End of Session Equipment Utilized During Treatment: Gait belt Activity Tolerance: Patient limited by pain;Other (comment) (c/o of tiredness and lightheadedness) Patient left: in bed;with call bell/phone within reach;with bed alarm set;with SCD's reapplied Nurse Communication: Mobility status PT Visit Diagnosis: Muscle weakness (generalized) (M62.81);Difficulty in walking, not elsewhere classified (R26.2)    Time: 5364-6803 PT Time Calculation (min) (ACUTE ONLY): 24 min   Charges:   PT Evaluation $PT Eval Low Complexity: 1 Low PT  Treatments $Therapeutic Activity: 8-22 mins        Wynn Maudlin, DPT Acute Rehabilitation Services  Office [phone] Pager [phone]  02/22/2020 4:22 PM

## 2020-02-22 NOTE — Anesthesia Procedure Notes (Signed)
Spinal  Start time: 02/22/2020 9:54 AM End time: 02/22/2020 9:58 AM Staffing Performed: anesthesiologist  Anesthesiologist: [name], MD Preanesthetic Checklist Completed: patient identified, IV checked, site marked, risks and benefits discussed, surgical consent, monitors and equipment checked, pre-op evaluation and timeout performed Spinal Block Patient position: sitting Prep: DuraPrep and site prepped and draped Location: L3-4 Injection technique: single-shot Needle Needle type: Pencan  Needle gauge: 24 G Needle length: 10 cm Needle insertion depth: 10 cm Additional Notes Patient tolerated well. No immediate complications.  Challenging placement due to patient positioning and arthritic changes  1st attempt by CRNA

## 2020-02-22 NOTE — Interval H&P Note (Signed)
History and Physical Interval Note: The patient understands that she is here for a left total hip arthroplasty to treat the severe osteoarthritis of her left hip.  There is been no acute change in her medical status.  See recent H&P.  The risk and benefits of surgery have been discussed in detail and informed consent is obtained.  The left hip has been marked.  02/22/2020 8:30 AM  [name]  has presented today for surgery, with the diagnosis of Osteoarthritis Left Hip.  The various methods of treatment have been discussed with the patient and family. After consideration of risks, benefits and other options for treatment, the patient has consented to  Procedure(s): LEFT TOTAL HIP ARTHROPLASTY ANTERIOR APPROACH (Left) as a surgical intervention.  The patient's history has been reviewed, patient examined, no change in status, stable for surgery.  I have reviewed the patient's chart and labs.  Questions were answered to the patient's satisfaction.     Kathryne Hitch

## 2020-02-22 NOTE — Anesthesia Preprocedure Evaluation (Addendum)
Anesthesia Evaluation  Patient identified by MRN, date of birth, ID band Patient awake    Reviewed: Allergy & Precautions, NPO status , Patient's Chart, lab work & pertinent test results  Airway Mallampati: I  TM Distance: >3 FB Neck ROM: Full    Dental  (+) Teeth Intact, Dental Advisory Given   Pulmonary former smoker,    breath sounds clear to auscultation       Cardiovascular negative cardio ROS   Rhythm:Regular Rate:Normal     Neuro/Psych  Neuromuscular disease negative psych ROS   GI/Hepatic negative GI ROS, Neg liver ROS,   Endo/Other  negative endocrine ROS  Renal/GU negative Renal ROS     Musculoskeletal  (+) Arthritis , Rheumatoid disorders,    Abdominal Normal abdominal exam  (+)   Peds  Hematology negative hematology ROS (+)   Anesthesia Other Findings   Reproductive/Obstetrics                           Anesthesia Physical Anesthesia Plan  ASA: II  Anesthesia Plan: Spinal   Post-op Pain Management:    Induction: Intravenous  PONV Risk Score and Plan: 3 and Propofol infusion, Ondansetron and Midazolam  Airway Management Planned: Natural Airway and Simple Face Mask  Additional Equipment: None  Intra-op Plan:   Post-operative Plan:   Informed Consent: I have reviewed the patients History and Physical, chart, labs and discussed the procedure including the risks, benefits and alternatives for the proposed anesthesia with the patient or authorized representative who has indicated his/her understanding and acceptance.       Plan Discussed with: CRNA  Anesthesia Plan Comments: (Lab Results      Component                Value               Date                      WBC                      6.4                 02/12/2020                HGB                      13.0                02/12/2020                HCT                      40.1                02/12/2020                 MCV                      95.2                02/12/2020                PLT                      210  02/12/2020            No results found for: INR, PROTIME )       Anesthesia Quick Evaluation

## 2020-02-22 NOTE — Anesthesia Procedure Notes (Addendum)
Procedure Name: MAC Date/Time: 02/22/2020 9:38 AM Performed by: Lissa Morales, CRNA Pre-anesthesia Checklist: Patient identified, Emergency Drugs available, Suction available, Patient being monitored and Timeout performed Patient Re-evaluated:Patient Re-evaluated prior to induction Oxygen Delivery Method: Simple face mask Placement Confirmation: positive ETCO2

## 2020-02-22 NOTE — Anesthesia Postprocedure Evaluation (Signed)
Anesthesia Post Note  Patient: [name]  Procedure(s) Performed: LEFT TOTAL HIP ARTHROPLASTY ANTERIOR APPROACH (Left Hip)     Patient location during evaluation: PACU Anesthesia Type: Spinal Level of consciousness: oriented and awake and alert Pain management: pain level controlled Vital Signs Assessment: post-procedure vital signs reviewed and stable Respiratory status: spontaneous breathing, respiratory function stable and patient connected to nasal cannula oxygen Cardiovascular status: blood pressure returned to baseline and stable Postop Assessment: no headache, no backache and no apparent nausea or vomiting Anesthetic complications: no   No complications documented.  Last Vitals:  Vitals:   02/22/20 1342 02/22/20 1402  BP: (!) 118/54 (!) 112/56  Pulse: 62 60  Resp: 18 17  Temp: 37.2 C (!) 36.4 C  SpO2: 100% 100%    Last Pain:  Vitals:   02/22/20 1402  TempSrc: Oral  PainSc:                  [name]

## 2020-02-22 NOTE — Brief Op Note (Signed)
02/22/2020  11:06 AM  PATIENT:  [name]  64 y.o. female  PRE-OPERATIVE DIAGNOSIS:  Osteoarthritis Left Hip  POST-OPERATIVE DIAGNOSIS:  Osteoarthritis Left Hip  PROCEDURE:  Procedure(s): LEFT TOTAL HIP ARTHROPLASTY ANTERIOR APPROACH (Left)  SURGEON:  Surgeon(s) and Role:    Kathryne Hitch, MD - Primary  PHYSICIAN ASSISTANT:  Rexene Edison, PA-C  ANESTHESIA:   spinal  EBL:  150 mL   COUNTS:  YES  DICTATION: .Other Dictation: Dictation Number [id number]  PLAN OF CARE: Admit for overnight observation  PATIENT DISPOSITION:  PACU - hemodynamically stable.   Delay start of Pharmacological VTE agent (>24hrs) due to surgical blood loss or risk of bleeding: no

## 2020-02-22 NOTE — TOC Transition Note (Signed)
Transition of Care [facility]) - CM/SW Discharge Note   Patient Details  Name: Raschelle Wisenbaker MRN: [id number] Date of Birth: 1956/02/04  [facility] of [facility]) CM/SW Contact:  Lennart Pall, LCSW Phone Number: 02/22/2020, 2:39 PM   Clinical Narrative:    Met briefly with pt and spouse to confirm DME and f/u.  Pt reports she already has needed DME from prior surgery.  HHPT already arranged via [facility]  No TOC needs.   Final next level of care: [place] Barriers to Discharge: No Barriers Identified   Patient Goals and CMS Choice Patient states their goals for this hospitalization and ongoing recovery are:: go home      Discharge Placement                       Discharge Plan and Services                DME Arranged: N/A DME Agency: NA       HH Arranged: PT HH Agency: [facility] [employer] Date [place]:  (pre-arranged via MD office)      Social Determinants of Health (SDOH) Interventions     Readmission Risk Interventions No flowsheet data found.

## 2020-02-23 DIAGNOSIS — M1612 Unilateral primary osteoarthritis, left hip: Secondary | ICD-10-CM | POA: Diagnosis not present

## 2020-02-23 LAB — BASIC METABOLIC PANEL
Anion gap: 10 (ref 5–15)
BUN: 17 mg/dL (ref 8–23)
CO2: 23 mmol/L (ref 22–32)
Calcium: 8.5 mg/dL — ABNORMAL LOW (ref 8.9–10.3)
Chloride: 103 mmol/L (ref 98–111)
Creatinine, Ser: 0.54 mg/dL (ref 0.44–1.00)
GFR calc Af Amer: >60 mL/min (ref >60)
GFR calc non Af Amer: >60 mL/min (ref >60)
Glucose, Bld: 141 mg/dL — ABNORMAL HIGH (ref 70–99)
Potassium: 4.3 mmol/L (ref 3.5–5.1)
Sodium: 136 mmol/L (ref 135–145)

## 2020-02-23 LAB — CBC
HCT: 33.9 % — ABNORMAL LOW (ref 36.0–46.0)
Hemoglobin: 10.8 g/dL — ABNORMAL LOW (ref 12.0–15.0)
MCH: 30.9 pg (ref 26.0–34.0)
MCHC: 31.9 g/dL (ref 30.0–36.0)
MCV: 97.1 fL (ref 80.0–100.0)
Platelets: 160 10*3/uL (ref 150–400)
RBC: 3.49 MIL/uL — ABNORMAL LOW (ref 3.87–5.11)
RDW: 13.3 % (ref 11.5–15.5)
WBC: 8.2 10*3/uL (ref 4.0–10.5)
nRBC: 0 % (ref 0.0–0.2)

## 2020-02-23 MED ORDER — METHOCARBAMOL 500 MG PO TABS: 500.0000 mg | ORAL_TABLET | Freq: Four times a day (QID) | ORAL | 1 refills | Status: DC | PRN

## 2020-02-23 MED ORDER — TRAMADOL HCL 50 MG PO TABS: 50.0000 mg | ORAL_TABLET | Freq: Four times a day (QID) | ORAL | 0 refills | Status: DC | PRN

## 2020-02-23 MED ORDER — ASPIRIN 81 MG PO CHEW: 81.0000 mg | CHEWABLE_TABLET | Freq: Two times a day (BID) | ORAL | 0 refills | Status: DC

## 2020-02-23 NOTE — Op Note (Signed)
[name], [name] MEDICAL RECORD ZO:[id number] ACCOUNT [id number] DATE OF BIRTH:1955-09-07 FACILITY: [facility] LOCATION: [facility] PHYSICIAN:Araly Kaas Aretha Parrot, MD  OPERATIVE REPORT  DATE OF PROCEDURE:  02/22/2020  PREOPERATIVE DIAGNOSIS:  Severe end-stage arthritis and degenerative joint disease, left hip, with rheumatoid arthritis superimposed.  POSTOPERATIVE DIAGNOSIS:  Severe end-stage arthritis and degenerative joint disease, left hip, with rheumatoid arthritis superimposed.  PROCEDURE:  Left total hip arthroplasty, direct anterior approach.  IMPLANTS:  DePuy Sector Gription acetabular component size 52, size 36+0 neutral polyethylene liner with a single screw in the acetabulum, size 12 Corail femoral component with high offset, size 36+1.5 metal hip ball.  SURGEON:  Vanita Panda.  Magnus Ivan, MD  ASSISTANT:  Richardean Canal, PA-C.  ANESTHESIA:  Spinal.  ANTIBIOTICS:  Two g IV Ancef.  ESTIMATED BLOOD LOSS:  150 mL.  COMPLICATIONS:  None.  INDICATIONS:  The patient is a 64 year old female with osteoarthritis and rheumatoid disease severely affecting her left hip.  She actually has a history of a right total hip arthroplasty and a right knee arthroplasty done elsewhere.  Her left hip is  actually starting to sublux.  It is high riding within the acetabulum and is severely deformed.  She walks with a significant Trendelenburg gait.  Her leg lengths are different as well with the left side being much shorter than the right.  At this point,  due to the severity of her pain and the detrimental effect this has had on her mobility, her quality of life and her activities of daily living, she does wish to proceed with a total hip arthroplasty.  We talked about the risks and benefits of surgery  in detail.  We discussed the risk of acute blood loss anemia, nerve or vessel injury, fracture, infection, dislocation, DVT and implant failure.  We talked about our goals being decreased pain,  improved mobility and overall improve quality of life.  DESCRIPTION OF PROCEDURE:  After informed consent was obtained, the appropriate left hip was marked.  She was brought to the operating room and sat up on a stretcher where spinal anesthesia was obtained.  She was laid in supine position on a stretcher.   A Foley catheter was placed.  I was able to assess her leg lengths and found them to have definitely shorter on the left than the right.  Traction boots were placed on both her feet.  Next, she was placed supine on the Hana fracture table, the perineal  post in place and both legs in in-line skeletal traction device and no traction applied.  Her left operative hip was prepped and draped with DuraPrep and sterile drapes.  Time-out was called to identify correct patient, correct left hip.  We then made an  incision just inferior and posterior to the anterior superior iliac spine and carried this obliquely down the leg.  We dissected down to the tensor fascia lata muscle.  Tensor fascia was then divided longitudinally to proceed with direct anterior  approach to the hip.  We identified and cauterized circumflex vessels.  I then identified the hip capsule, opened up the hip capsule in an L-type format, finding a moderate joint effusion and significant arthritis and synovitis around the left hip.  We  placed curved retractors around the medial and lateral femoral neck within the hip capsule and made our femoral neck cut with oscillating saw just proximal to the lesser trochanter.  We completed this with an osteotome.  We placed a corkscrew guide in  the femoral head and removed  the femoral head in its entirety and found a wide area devoid of cartilage and deformity of the hip with flattening.  It had definitely created a pseudoacetabulum as well.  I placed a bent Hohmann over the medial acetabular  rim and removed remnants of the acetabular labrum and other debris.  I then began reaming under direct  visualization and direct fluoroscopy for all reamers due to the deformity of her hip.  We went from 44 up to a size 51.  The last reamer was definitely  under direct fluoroscopy as well so we could obtain our depth of reaming, our inclination and anteversion.  I then placed the real DePuy Sector Gription acetabular component size 52.  Due to some ____ coverage and even though it felt stable, I still  placed a single screw that was 25 mm in length.  I then went with the 36+0 neutral polyethylene liner.  Attention was then turned to the femur.  With the leg externally rotated to 120 degrees, extended and adducted, we are to place a Mueller retractor  medially and a Hohman retractor behind the greater trochanter.  We used a box box-cutting osteotome to enter the femoral canal and a rongeur to lateralize.  I then began broaching using the Corail broaching system from a size 8 going up to a size 12.   With a size 12 in place, we trialed a standard offset femoral neck and went with a 36 minus 2 hip ball first due to the significant shortening  she had had, reduced this in the acetabulum and it was stable, but obviously radiographically we felt like we  needed more offset and leg length.  We dislocated the hip and removed the trial components.  We decided to go with the real Corail femoral component size 12, but with high offset.  We placed this without difficulty and then went with the real 36+1.5  metal hip ball and again reduced this in the acetabulum and it was nice and tight and we felt good about her leg length, offset, range of motion and stability assessed mechanically and radiographically.  We then irrigated the soft tissue with normal  saline solution using pulse lavage.  We closed the joint capsule with interrupted #1 Ethibond suture, followed by closing the tensor fascia with #1 Vicryl, 0 Vicryl was used to close deep tissue and 2-0 Vicryl was used to close the subcutaneous tissue.   Staples were used  to close the skin.  Xeroform and Aquacel dressing was applied.  She was then taken off the Hana table and taken to recovery room in stable condition.  All final counts were correct.  There were no complications noted.  Of note, Rexene Edison, PA-C, assisted during the entire case and his assistance was crucial for facilitating all aspects of this case.  VN/NUANCE  D:02/22/2020 T:02/22/2020 JOB:[id number]

## 2020-02-23 NOTE — Progress Notes (Signed)
Subjective: 1 Day Post-Op Procedure(s) (LRB): LEFT TOTAL HIP ARTHROPLASTY ANTERIOR APPROACH (Left) Patient reports pain as moderate.    Objective: Vital signs in last 24 hours: Temp:  [96.2 F (35.7 C)-99 F (37.2 C)] 98 F (36.7 C) (07/31 0538) Pulse Rate:  [51-79] 59 (07/31 0538) Resp:  [9-20] 15 (07/31 0538) BP: (96-140)/(48-85) 112/49 (07/31 0538) SpO2:  [98 %-100 %] 99 % (07/31 0538)  Intake/Output from previous day: 07/30 0701 - 07/31 0700 In: 4026.8 [P.O.:520; I.V.:3256.8; IV Piggyback:250] Out: 1200 [Urine:1050; Blood:150] Intake/Output this shift: Total I/O In: -  Out: 300 [Urine:300]  Recent Labs    02/23/20 0312  HGB 10.8*   Recent Labs    02/23/20 0312  WBC 8.2  RBC 3.49*  HCT 33.9*  PLT 160   Recent Labs    02/23/20 0312  NA 136  K 4.3  CL 103  CO2 23  BUN 17  CREATININE 0.54  GLUCOSE 141*  CALCIUM 8.5*   No results for input(s): LABPT, INR in the last 72 hours.  Sensation intact distally Intact pulses distally Dorsiflexion/Plantar flexion intact Incision: scant drainage   Assessment/Plan: 1 Day Post-Op Procedure(s) (LRB): LEFT TOTAL HIP ARTHROPLASTY ANTERIOR APPROACH (Left) Up with therapy Discharge home with home health    Patient's anticipated LOS is less than 2 midnights, meeting these requirements: - Younger than 34 - Lives within 1 hour of care - Has a competent adult at home to recover with post-op recover - NO history of  - Chronic pain requiring opiods  - Diabetes  - Coronary Artery Disease  - Heart failure  - Heart attack  - Stroke  - DVT/VTE  - Cardiac arrhythmia  - Respiratory Failure/COPD  - Renal failure  - Anemia  - Advanced Liver disease       Kathryne Hitch 02/23/2020, 10:10 AM

## 2020-02-23 NOTE — Progress Notes (Signed)
Physical Therapy Treatment Patient Details Name: [name] MRN: [id number] DOB: 01-27-1956 Today's Date: 02/23/2020    History of Present Illness Patient is 64 y.o. female s/p Lt THA anterior approach on 02/22/20 with PMH significant for RA, anemia, Rt TKA in 2020.    PT Comments    Pt continues to progress well. Reviewed/practiced gait and stair training. All education completed. Okay to d/c from PT standpoint.    Follow Up Recommendations  Follow surgeon's recommendation for DC plan and follow-up therapies;Supervision/Assistance - 24 hour     Equipment Recommendations  None recommended by PT    Recommendations for Other Services       Precautions / Restrictions Precautions Precautions: Fall Restrictions Weight Bearing Restrictions: No Other Position/Activity Restrictions: WBAT    Mobility  Bed Mobility Overal bed mobility: Needs Assistance Bed Mobility: Supine to Sit          General bed mobility comments: oob in recliner  Transfers Overall transfer level: Needs assistance Equipment used: Rolling walker (2 wheeled) Transfers: Sit to/from Stand Sit to Stand: Supervision         General transfer comment: Increased time.  Ambulation/Gait Ambulation/Gait assistance: Min guard Gait Distance (Feet): 120 Feet Assistive device: Rolling walker (2 wheeled) Gait Pattern/deviations: Step-to pattern;Step-through pattern;Decreased stride length     General Gait Details: Min guard for safety.   Stairs Stairs: Yes Stairs assistance: Min guard Stair Management: Step to pattern;Forwards;Two rails Number of Stairs: 2 General stair comments: VCs safety, technique, sequence. Min guard for safety.   Wheelchair Mobility    Modified Rankin (Stroke Patients Only)       Balance Overall balance assessment: Needs assistance         Standing balance support: Bilateral upper extremity supported Standing balance-Leahy Scale: Fair                               Cognition Arousal/Alertness: Awake/alert Behavior During Therapy: WFL for tasks assessed/performed Overall Cognitive Status: Within Functional Limits for tasks assessed                                        Exercises Total Joint Exercises Ankle Circles/Pumps: AROM;Both;10 reps Quad Sets: AROM;Both;10 reps Heel Slides: AAROM;Left;10 reps Hip ABduction/ADduction: AAROM;Left;10 reps    General Comments        Pertinent Vitals/Pain Pain Assessment: 0-10 Pain Score: 2  Pain Location: L hip/thigh Pain Descriptors / Indicators: Sore;Tightness;Discomfort Pain Intervention(s): Monitored during session    Home Living                      Prior Function            PT Goals (current goals can now be found in the care plan section) Progress towards PT goals: Progressing toward goals    Frequency    7X/week      PT Plan Current plan remains appropriate    Co-evaluation              AM-PAC PT "6 Clicks" Mobility   Outcome Measure  Help needed turning from your back to your side while in a flat bed without using bedrails?: A Little Help needed moving from lying on your back to sitting on the side of a flat bed without using bedrails?: A Little Help needed moving to and from a  bed to a chair (including a wheelchair)?: A Little Help needed standing up from a chair using your arms (e.g., wheelchair or bedside chair)?: A Little Help needed to walk in hospital room?: A Little Help needed climbing 3-5 steps with a railing? : A Little 6 Click Score: 18    End of Session Equipment Utilized During Treatment: Gait belt Activity Tolerance: Patient tolerated treatment well Patient left: in chair;with call bell/phone within reach   PT Visit Diagnosis: Other abnormalities of gait and mobility (R26.89)     Time: 8811-0315 PT Time Calculation (min) (ACUTE ONLY): 18 min  Charges:  $Gait Training: 8-22 mins $Therapeutic Exercise: 8-22  mins                         Faye Ramsay, PT Acute Rehabilitation  Office: [phone] Pager: [phone]

## 2020-02-23 NOTE — Plan of Care (Signed)
  Problem: Education: Goal: Knowledge of General Education information will improve Description: Including pain rating scale, medication(s)/side effects and non-pharmacologic comfort measures Outcome: Progressing   Problem: Pain Managment: Goal: General experience of comfort will improve Outcome: Progressing   Problem: Pain Management: Goal: Pain level will decrease with appropriate interventions Outcome: Progressing   

## 2020-02-23 NOTE — Discharge Summary (Signed)
Patient ID: [name] MRN: [id number] DOB/AGE: 02-25-1956 64 y.o.  Admit date: 02/22/2020 Discharge date: 02/23/2020  Admission Diagnoses:  Principal Problem:   Unilateral primary osteoarthritis, left hip Active Problems:   Status post total replacement of left hip   Discharge Diagnoses:  Same  Past Medical History:  Diagnosis Date  . Carpal tunnel syndrome, right    History of  . History of blood transfusion    12 years ago  . Iron deficiency anemia   . RA (rheumatoid arthritis) (HCC)     Surgeries: Procedure(s): LEFT TOTAL HIP ARTHROPLASTY ANTERIOR APPROACH on 02/22/2020   Consultants:   Discharged Condition: Improved  Hospital Course: [name] is an 64 y.o. female who was admitted 02/22/2020 for operative treatment ofUnilateral primary osteoarthritis, left hip. Patient has severe unremitting pain that affects sleep, daily activities, and work/hobbies. After pre-op clearance the patient was taken to the operating room on 02/22/2020 and underwent  Procedure(s): LEFT TOTAL HIP ARTHROPLASTY ANTERIOR APPROACH.    Patient was given perioperative antibiotics:  Anti-infectives (From admission, onward)   Start     Dose/Rate Route Frequency Ordered Stop   02/22/20 1600  ceFAZolin (ANCEF) IVPB 1 g/50 mL premix        1 g 100 mL/hr over 30 Minutes Intravenous Every 6 hours 02/22/20 1241 02/22/20 2317   02/22/20 0745  ceFAZolin (ANCEF) IVPB 2g/100 mL premix        2 g 200 mL/hr over 30 Minutes Intravenous On call to O.R. 02/22/20 8099 02/22/20 0941       Patient was given sequential compression devices, early ambulation, and chemoprophylaxis to prevent DVT.  Patient benefited maximally from hospital stay and there were no complications.    Recent vital signs:  Patient Vitals for the past 24 hrs:  BP Temp Temp src Pulse Resp SpO2  02/23/20 1011 (!) 103/46 98.4 F (36.9 C) Oral 69 18 100 %  02/23/20 0538 (!) 112/49 98 F (36.7 C) Oral 59 15 99 %  02/23/20 0127  (!) 112/58 98 F (36.7 C) Oral 61 16 98 %  02/22/20 2154 (!) 115/58 98.1 F (36.7 C) Oral 65 16 98 %  02/22/20 1906 (!) 96/56 98.4 F (36.9 C) -- 63 20 99 %  02/22/20 1708 (!) 124/60 98.3 F (36.8 C) Oral 64 17 98 %  02/22/20 1553 (!) 117/48 98.4 F (36.9 C) Oral 70 17 100 %  02/22/20 1457 (!) 123/59 97.6 F (36.4 C) Oral 73 17 100 %  02/22/20 1402 (!) 112/56 (!) 97.5 F (36.4 C) Oral 60 17 100 %  02/22/20 1342 (!) 118/54 99 F (37.2 C) Oral 62 18 100 %  02/22/20 1247 (!) 132/64 97.8 F (36.6 C) Oral 60 16 100 %  02/22/20 1230 (!) 120/63 (!) 97.3 F (36.3 C) -- 79 14 100 %  02/22/20 1215 (!) 140/69 (!) 97 F (36.1 C) -- 58 13 100 %  02/22/20 1200 (!) 136/65 (!) 97 F (36.1 C) -- 56 12 100 %  02/22/20 1145 (!) 127/61 (!) 96.7 F (35.9 C) -- 60 18 100 %  02/22/20 1130 (!) 121/61 (!) 96.2 F (35.7 C) -- 51 13 100 %  02/22/20 1126 109/85 (!) 96.2 F (35.7 C) -- 57 (!) 9 100 %     Recent laboratory studies:  Recent Labs    02/23/20 0312  WBC 8.2  HGB 10.8*  HCT 33.9*  PLT 160  NA 136  K 4.3  CL 103  CO2 23  BUN 17  CREATININE 0.54  GLUCOSE 141*  CALCIUM 8.5*     Discharge Medications:   Allergies as of 02/23/2020   No Known Allergies     Medication List    TAKE these medications   ascorbic acid 500 MG tablet Commonly known as: VITAMIN C Take 500 mg by mouth daily.   aspirin 81 MG chewable tablet Chew 1 tablet (81 mg total) by mouth 2 (two) times daily.   cholecalciferol 25 MCG (1000 UNIT) tablet Commonly known as: VITAMIN D3 Take 1,000 Units by mouth daily.   folic acid 1 MG tablet Commonly known as: FOLVITE Take 1 mg by mouth daily.   methocarbamol 500 MG tablet Commonly known as: ROBAXIN Take 1 tablet (500 mg total) by mouth every 6 (six) hours as needed for muscle spasms.   methotrexate 2.5 MG tablet Take 5 mg by mouth 2 (two) times a week.   traMADol 50 MG tablet Commonly known as: ULTRAM Take 1 tablet (50 mg total) by mouth every 6  (six) hours as needed.            Durable Medical Equipment  (From admission, onward)         Start     Ordered   02/22/20 1242  DME 3 n 1  Once        02/22/20 1241   02/22/20 1242  DME Walker rolling  Once       Question Answer Comment  Walker: With 5 Inch Wheels   Patient needs a walker to treat with the following condition Status post total replacement of left hip      02/22/20 1241          Diagnostic Studies: DG Pelvis Portable  Result Date: 02/22/2020 CLINICAL DATA:  Left hip replacement. EXAM: PORTABLE PELVIS 1-2 VIEWS COMPARISON:  Earlier same day FINDINGS: Total hip arthroplasty on the left. Components appear well positioned. No radiographically detectable complication. Old right hip replacement. IMPRESSION: Good appearance following left hip arthroplasty. No complicating features evident. Electronically Signed   By: Paulina Fusi M.D.   On: 02/22/2020 11:58   DG C-Arm 1-60 Min-No Report  Result Date: 02/22/2020 Fluoroscopy was utilized by the requesting physician.  No radiographic interpretation.   DG HIP OPERATIVE UNILAT W OR W/O PELVIS LEFT  Result Date: 02/22/2020 CLINICAL DATA:  Left hip replacement EXAM: OPERATIVE LEFT HIP (WITH PELVIS IF PERFORMED) 7 VIEWS TECHNIQUE: Fluoroscopic spot image(s) were submitted for interpretation post-operatively. COMPARISON:  01/01/2020 FINDINGS: Remote changes of right hip replacement. New changes of left hip replacement. Normal AP alignment. No hardware bony complicating feature. IMPRESSION: Left hip replacement.  No visible complicating feature. Electronically Signed   By: Charlett Nose M.D.   On: 02/22/2020 11:22    Disposition: Discharge disposition: 01-Home or Self Care          Follow-up Information    [facility] Follow up.   Specialty: Home Health Services Why: to provide home health physical therapy Contact information: [address] [place] [address] [phone]        Kathryne Hitch, MD Follow up in 2 week(s).   Specialty: Orthopedic Surgery Contact information: [address] [place] [address] [phone]                Signed: Kathryne Hitch 02/23/2020, 10:13 AM

## 2020-02-23 NOTE — Discharge Instructions (Signed)

## 2020-02-23 NOTE — Progress Notes (Signed)
Physical Therapy Treatment Patient Details Name: [name] MRN: [id number] DOB: [birth year] Today's Date: 02/23/2020    History of Present Illness Patient is 64 y.o. female s/p Lt THA anterior approach on 02/22/20 with PMH significant for RA, anemia, Rt TKA in 2020.    PT Comments    Progressing with mobility. Will plan to have a 2nd session prior to d/c home later today.    Follow Up Recommendations  Follow surgeons recommendation for DC plan and follow-up therapies;Supervision/Assistance - 24 hour     Equipment Recommendations  None recommended by PT    Recommendations for Other Services       Precautions / Restrictions Precautions Precautions: Fall Restrictions Weight Bearing Restrictions: No Other Position/Activity Restrictions: WBAT    Mobility  Bed Mobility Overal bed mobility: Needs Assistance Bed Mobility: Supine to Sit     Supine to sit: Min assist;HOB elevated     General bed mobility comments: Increased time. Small amount of assist for L LE.  Transfers Overall transfer level: Needs assistance Equipment used: Rolling walker (2 wheeled) Transfers: Sit to/from Stand Sit to Stand: Min guard         General transfer comment: VCs safety, hand placement. Increased time.  Ambulation/Gait Ambulation/Gait assistance: Min guard Gait Distance (Feet): 60 Feet Assistive device: Rolling walker (2 wheeled) Gait Pattern/deviations: Step-to pattern;Step-through pattern;Decreased stride length;Decreased step length - right;Decreased step length - left     General Gait Details: Mod cues for safety, sequence, RW proximity, step lengths. Slow gait speed.   Stairs             Wheelchair Mobility    Modified Rankin (Stroke Patients Only)       Balance Overall balance assessment: Needs assistance         Standing balance support: Bilateral upper extremity supported Standing balance-Leahy Scale: Fair                               Cognition Arousal/Alertness: Awake/alert Behavior During Therapy: WFL for tasks assessed/performed Overall Cognitive Status: Within Functional Limits for tasks assessed                                        Exercises Total Joint Exercises Ankle Circles/Pumps: AROM;Both;10 reps Quad Sets: AROM;Both;10 reps Heel Slides: AAROM;Left;10 reps Hip ABduction/ADduction: AAROM;Left;10 reps    General Comments        Pertinent Vitals/Pain Pain Assessment: 0-10 Pain Score: 2  Pain Location: L hip/thigh Pain Descriptors / Indicators: Sore;Tightness;Discomfort Pain Intervention(s): Monitored during session;Repositioned    Home Living                      Prior Function            PT Goals (current goals can now be found in the care plan section) Progress towards PT goals: Progressing toward goals    Frequency    7X/week      PT Plan Current plan remains appropriate    Co-evaluation              AM-PAC PT "6 Clicks" Mobility   Outcome Measure  Help needed turning from your back to your side while in a flat bed without using bedrails?: A Little Help needed moving from lying on your back to sitting on the side of a flat bed  without using bedrails?: A Little Help needed moving to and from a bed to a chair (including a wheelchair)?: A Little Help needed standing up from a chair using your arms (e.g., wheelchair or bedside chair)?: A Little Help needed to walk in hospital room?: A Little Help needed climbing 3-5 steps with a railing? : A Little 6 Click Score: 18    End of Session Equipment Utilized During Treatment: Gait belt Activity Tolerance: Patient tolerated treatment well Patient left: in chair;with call bell/phone within reach   PT Visit Diagnosis: Other abnormalities of gait and mobility (R26.89)     Time: 0630-1601 PT Time Calculation (min) (ACUTE ONLY): 23 min  Charges:  $Gait Training: 8-22 mins $Therapeutic Exercise: 8-22  mins                         Faye Ramsay, PT Acute Rehabilitation  Office: [phone] Pager: [phone]

## 2020-02-23 NOTE — Plan of Care (Signed)
All discharge medications were given to Pt. All questions were answered.

## 2020-02-25 ENCOUNTER — Encounter (HOSPITAL_COMMUNITY): Payer: Self-pay | Admitting: Orthopaedic Surgery

## 2020-03-06 ENCOUNTER — Ambulatory Visit (INDEPENDENT_AMBULATORY_CARE_PROVIDER_SITE_OTHER): Payer: 59 | Admitting: Physician Assistant

## 2020-03-06 ENCOUNTER — Encounter: Payer: Self-pay | Admitting: Physician Assistant

## 2020-03-06 DIAGNOSIS — Z96642 Presence of left artificial hip joint: Secondary | ICD-10-CM

## 2020-03-06 NOTE — Progress Notes (Signed)
HPI: [name] returns today status post left total hip arthroplasty now 2 weeks postop.  She is overall doing well.  She has had no fevers chills.  She is ambulating with a cane.  She feels that overall physical therapy is going well.  She denies any shortness of breath or calf pain.  She is taking no pain medication only taking extra Tylenol.  She is been on aspirin twice daily for DVT prophylaxis.   Physical exam: Left hip surgical incisions well approximated with staples no signs of infection.  No seroma.  Left hip overall good range of motion.  She is able to get on and off the exam table on her own.  Dorsiflexion plantarflexion left ankle intact.  Calf supple nontender.  Impression: Status post left total hip arthroplasty 02/22/2020  Plan: Staples removed Steri-Strips applied.  She will work on scar tissue mobilization.  She will follow-up with [place] in 1 month sooner if there is any questions concerns.  Questions were encouraged and answered at length today.

## 2020-03-24 ENCOUNTER — Encounter: Payer: Self-pay | Admitting: Family Medicine

## 2020-03-24 DIAGNOSIS — M069 Rheumatoid arthritis, unspecified: Secondary | ICD-10-CM

## 2020-04-03 ENCOUNTER — Ambulatory Visit (INDEPENDENT_AMBULATORY_CARE_PROVIDER_SITE_OTHER): Payer: Medicare Other | Admitting: Orthopaedic Surgery

## 2020-04-03 ENCOUNTER — Encounter: Payer: Self-pay | Admitting: Orthopaedic Surgery

## 2020-04-03 DIAGNOSIS — Z96642 Presence of left artificial hip joint: Secondary | ICD-10-CM

## 2020-04-03 NOTE — Progress Notes (Signed)
The patient is now about 6-week status post a left total hip arthroplasty.  She actually had a right posterior hip replacement done 12 years ago.  The left hip we did do a direct anterior approach.  She says since night and a different.  She has no complaints.  She states her range of motion and strength are improved quite a bit.  She is walking without a limp or assistive device.  She is a very active 64 year old female.  On exam her hip moves smoothly on the left and right side.  Her left hip incisions healed nicely.  Her leg lengths are equal.  At this point she will continue to increase her activities as comfort allows.  I will see her back in 6 months with a standing low AP pelvis and lateral of her left operative hip.  If there is any issues before then she will let us know.

## 2020-04-29 DIAGNOSIS — M0579 Rheumatoid arthritis with rheumatoid factor of multiple sites without organ or systems involvement: Secondary | ICD-10-CM | POA: Diagnosis not present

## 2020-04-29 DIAGNOSIS — M150 Primary generalized (osteo)arthritis: Secondary | ICD-10-CM | POA: Diagnosis not present

## 2020-07-30 DIAGNOSIS — M150 Primary generalized (osteo)arthritis: Secondary | ICD-10-CM | POA: Diagnosis not present

## 2020-07-30 DIAGNOSIS — M0579 Rheumatoid arthritis with rheumatoid factor of multiple sites without organ or systems involvement: Secondary | ICD-10-CM | POA: Diagnosis not present

## 2020-10-01 ENCOUNTER — Ambulatory Visit: Payer: Medicare Other | Admitting: Orthopaedic Surgery

## 2020-10-14 ENCOUNTER — Ambulatory Visit: Payer: Self-pay

## 2020-10-14 ENCOUNTER — Ambulatory Visit: Payer: Medicare Other | Admitting: Orthopaedic Surgery

## 2020-10-14 ENCOUNTER — Encounter: Payer: Self-pay | Admitting: Orthopaedic Surgery

## 2020-10-14 DIAGNOSIS — Z96642 Presence of left artificial hip joint: Secondary | ICD-10-CM | POA: Diagnosis not present

## 2020-10-14 NOTE — Progress Notes (Signed)
The patient is now 8 months status post a left total hip arthroplasty through direct anterior approach.  She has remote history of a right total hip that was done posteriorly.  She says she is doing well overall and has no issues with either hip.  She does feel the recovery was much quicker with the left hip done through an anterior approach.  She has no complaints.  She reports good range of motion.  She reports equal leg lengths and no pain.  Both hips move smoothly and fluidly with no complicating features at all.  Her leg lengths are also equal.  An AP pelvis and lateral left hip shows a well-seated hip replacement left side.  The AP view also shows a right total hip arthroplasty that appears to be well-seated with no complicating features.  At this point follow-up can be as needed since she is doing well.  She understands that if she develops any aching type of pain with either hip we can see her at any point.  All questions and concerns were answered and addressed.

## 2020-10-28 DIAGNOSIS — M0579 Rheumatoid arthritis with rheumatoid factor of multiple sites without organ or systems involvement: Secondary | ICD-10-CM | POA: Diagnosis not present

## 2020-10-28 DIAGNOSIS — M150 Primary generalized (osteo)arthritis: Secondary | ICD-10-CM | POA: Diagnosis not present

## 2021-01-21 IMAGING — DX DG PORTABLE PELVIS
2 series · 2 of 2 positions shown · non-contrast
Comparison: Earlier same day

CLINICAL DATA: Left hip replacement.

EXAM:
PORTABLE PELVIS 1-2 VIEWS

[pelvis ap (1 of 2)]
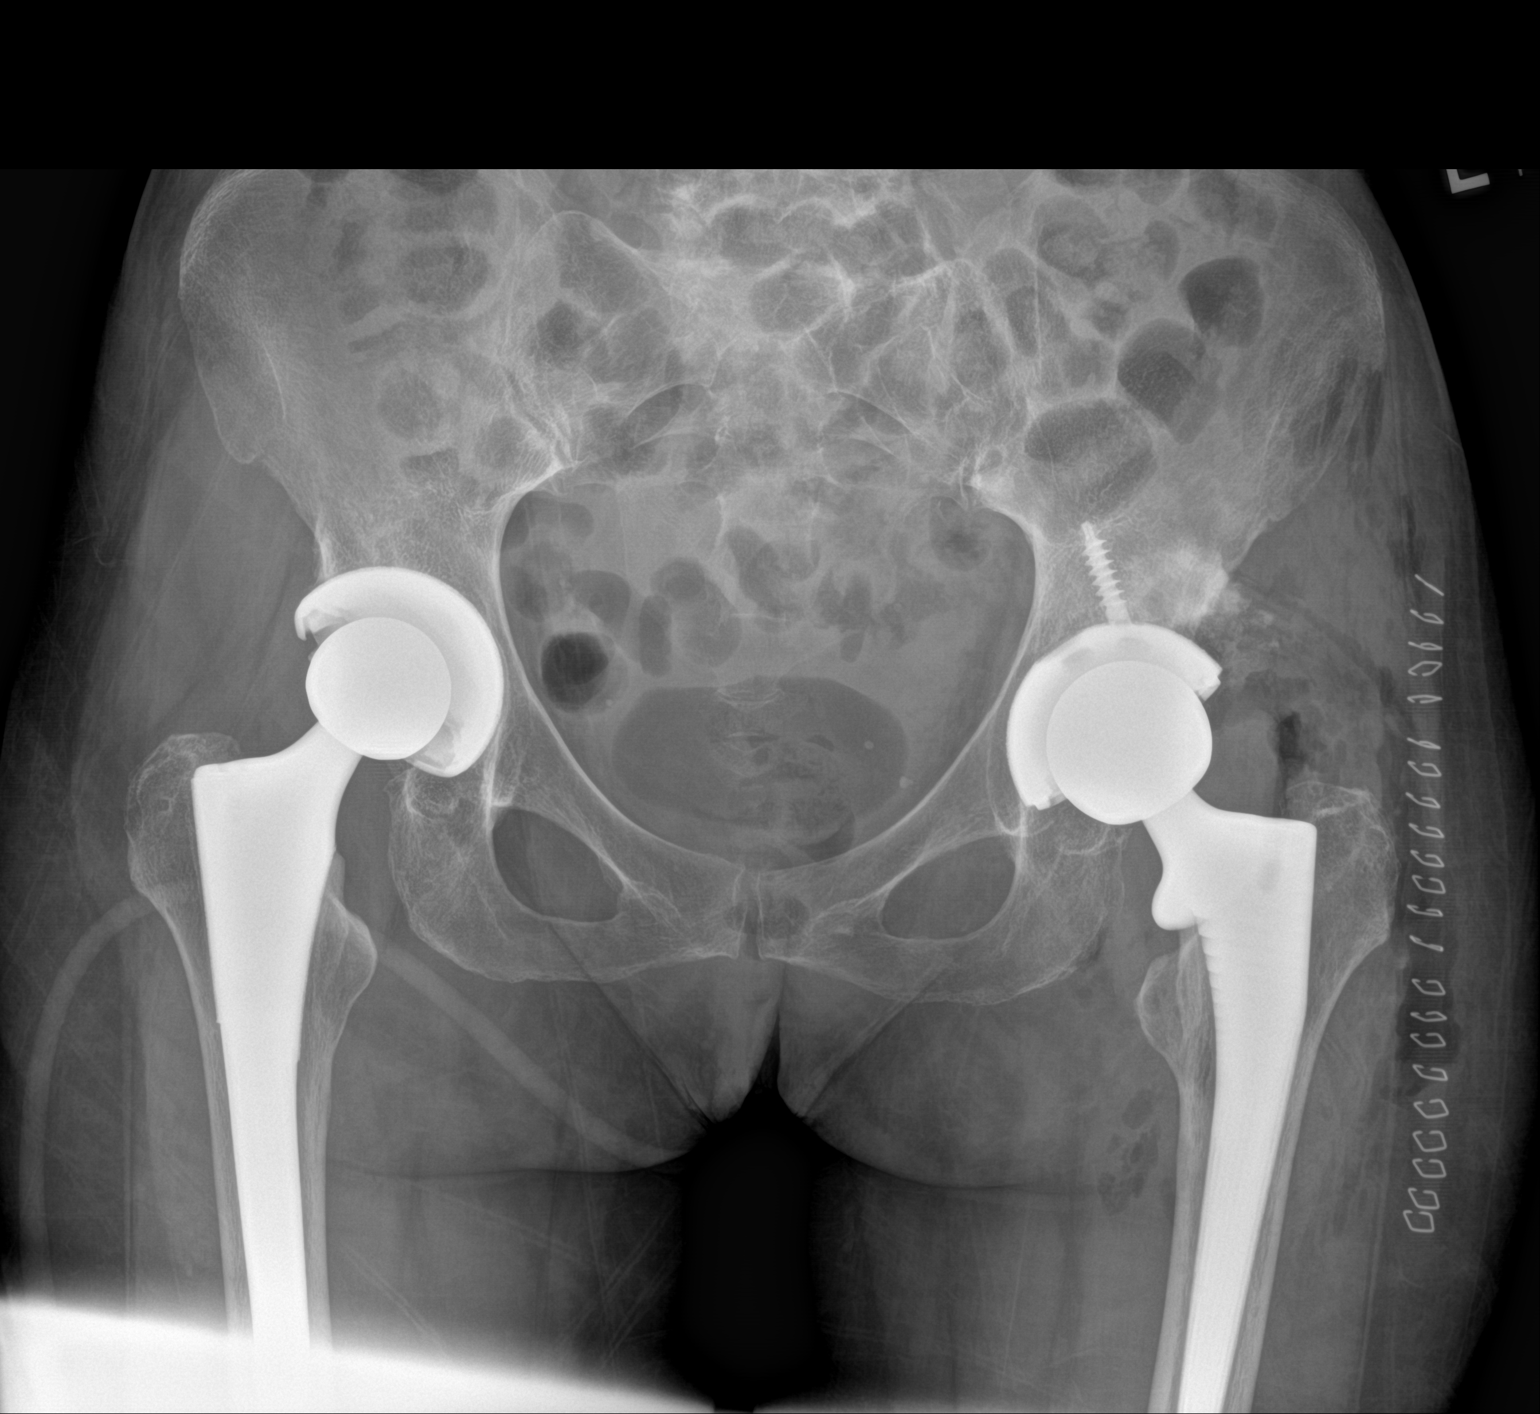

[pelvis ap (2 of 2)]
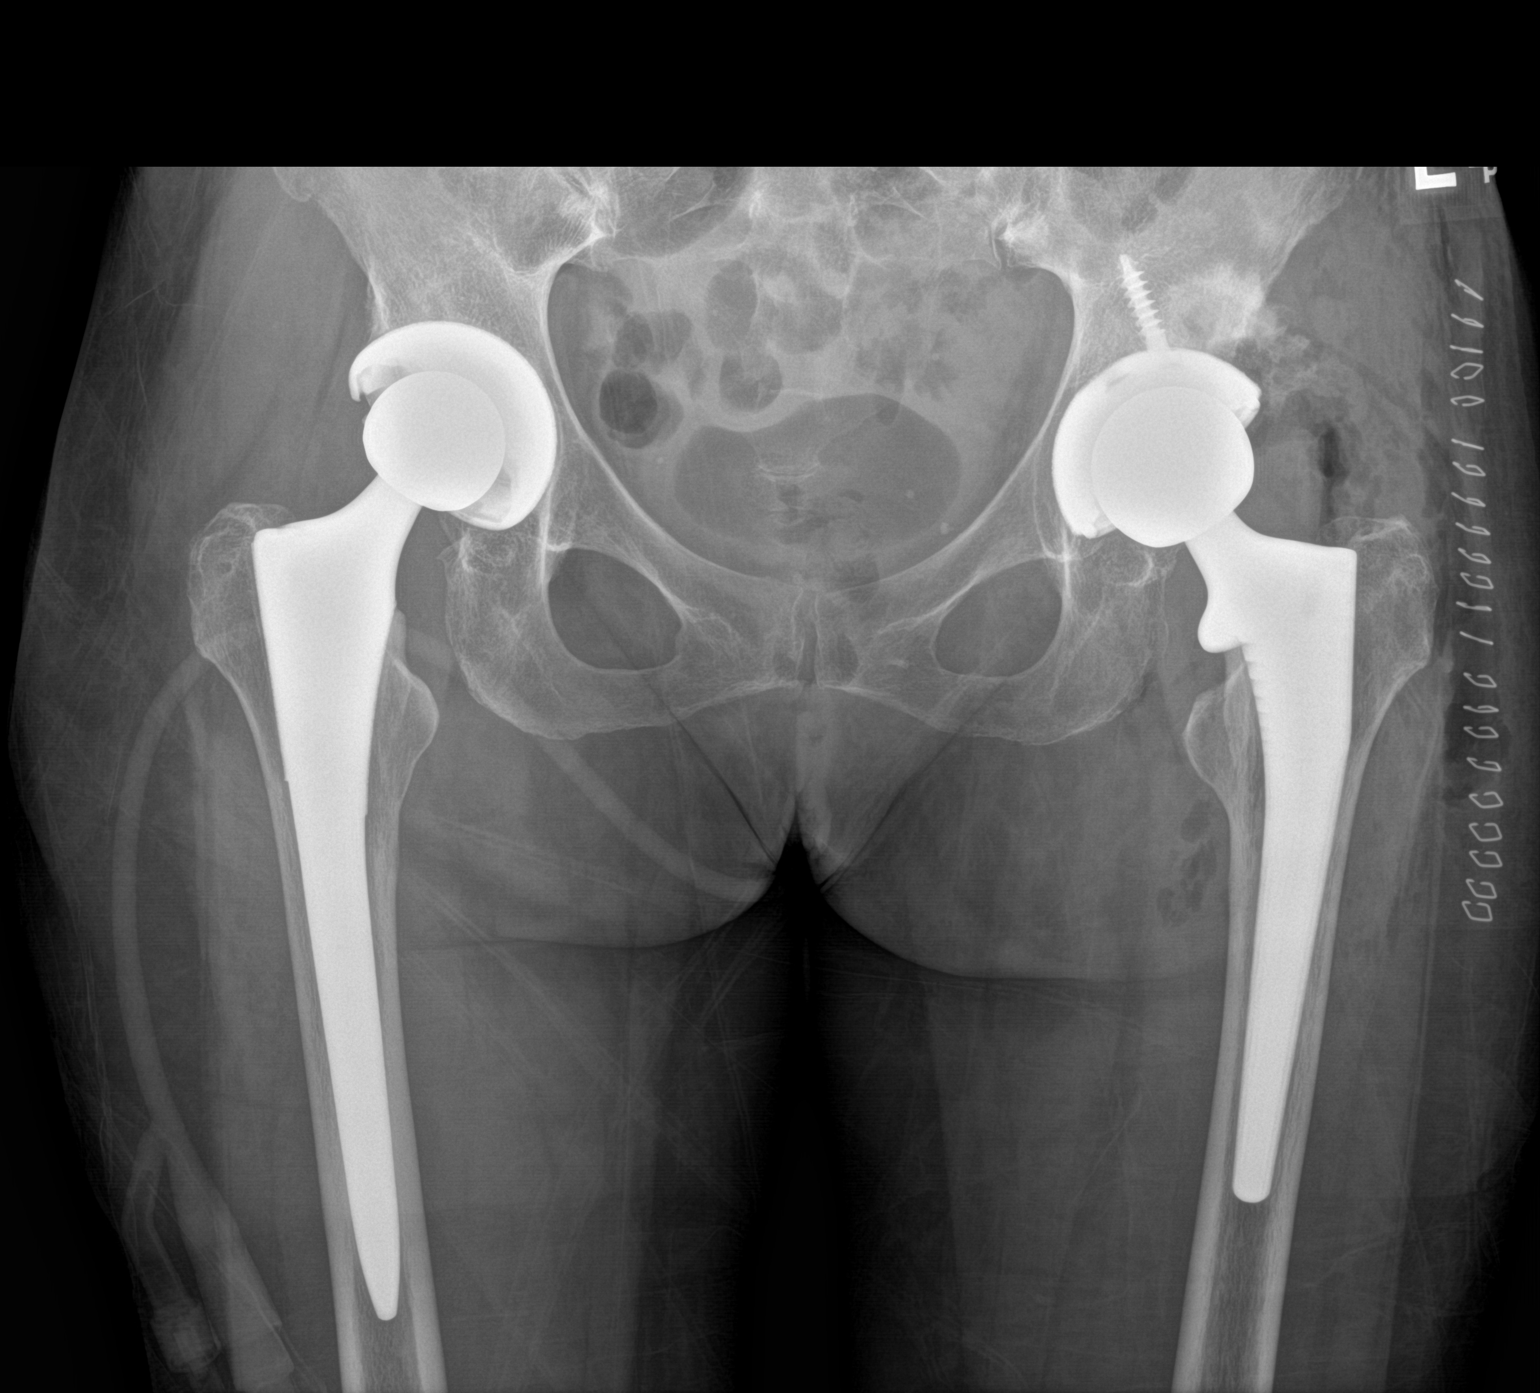

[2 of 2 positions shown; findings below may reference images not displayed]

FINDINGS: Total hip arthroplasty on the left. Components appear well
positioned. No radiographically detectable complication. Old right
hip replacement.
IMPRESSION: Good appearance following left hip arthroplasty. No complicating
features evident.

## 2021-01-28 DIAGNOSIS — M0579 Rheumatoid arthritis with rheumatoid factor of multiple sites without organ or systems involvement: Secondary | ICD-10-CM | POA: Diagnosis not present

## 2021-01-28 DIAGNOSIS — M150 Primary generalized (osteo)arthritis: Secondary | ICD-10-CM | POA: Diagnosis not present

## 2021-05-05 DIAGNOSIS — M0579 Rheumatoid arthritis with rheumatoid factor of multiple sites without organ or systems involvement: Secondary | ICD-10-CM | POA: Diagnosis not present

## 2021-05-05 DIAGNOSIS — Z13220 Encounter for screening for lipoid disorders: Secondary | ICD-10-CM | POA: Diagnosis not present

## 2021-06-24 ENCOUNTER — Other Ambulatory Visit: Payer: Self-pay

## 2021-06-24 ENCOUNTER — Encounter: Payer: Self-pay | Admitting: Family

## 2021-06-24 ENCOUNTER — Ambulatory Visit (INDEPENDENT_AMBULATORY_CARE_PROVIDER_SITE_OTHER): Payer: Medicare Other | Admitting: Family

## 2021-06-24 VITALS — BP 122/60 | HR 77 | Temp 98.0°F | Ht 63.0 in | Wt 121.2 lb

## 2021-06-24 DIAGNOSIS — Z0000 Encounter for general adult medical examination without abnormal findings: Secondary | ICD-10-CM

## 2021-06-24 NOTE — Progress Notes (Signed)
.awv   Subjective:   [name] is a 65 y.o. female who presents for an Initial Medicare Annual Wellness Visit.  Review of Systems    Negative except for rheumatoid arthritis in bilateral hips, hands.      Objective:    Today's Vitals   06/24/21 0848  BP: 122/60  Pulse: 77  Temp: 98 F (36.7 C)  TempSrc: Temporal  SpO2: 99%  Weight: 121 lb 4 oz (55 kg)  Height: 5\' 3"  (1.6 m)   Body mass index is 21.48 kg/m.  Advanced Directives 02/22/2020 02/12/2020  Does Patient Have a Medical Advance Directive? Yes Yes  Type of 02/14/2020 of [place];Living will Healthcare Power of [place];Living will  Does patient want to make changes to medical advance directive? No - Patient declined No - Patient declined  Copy of Healthcare Power of Attorney in Chart? No - copy requested No - copy requested    Current Medications (verified) Outpatient Encounter Medications as of 06/24/2021  Medication Sig   ascorbic acid (VITAMIN C) 500 MG tablet Take 500 mg by mouth daily.   Cholecalciferol (D3-50 PO) Take 50 mg by mouth 2 (two) times daily.   folic acid (FOLVITE) 1 MG tablet Take 1 mg by mouth daily.   methotrexate 2.5 MG tablet Take 5 mg by mouth 2 (two) times a week.   [DISCONTINUED] aspirin 81 MG chewable tablet Chew 1 tablet (81 mg total) by mouth 2 (two) times daily.   [DISCONTINUED] cholecalciferol (VITAMIN D3) 25 MCG (1000 UNIT) tablet Take 1,000 Units by mouth daily.   No facility-administered encounter medications on file as of 06/24/2021.    Allergies (verified) Patient has no known allergies.   History: Past Medical History:  Diagnosis Date   Carpal tunnel syndrome, right    History of   History of blood transfusion    12 years ago   Iron deficiency anemia    RA (rheumatoid arthritis) (HCC)    Past Surgical History:  Procedure Laterality Date   CARPAL TUNNEL RELEASE Right 04/1990   CESAREAN SECTION     x2   COLONOSCOPY     TONSILLECTOMY      TOTAL HIP ARTHROPLASTY Right 02/2019   TOTAL HIP ARTHROPLASTY Left 02/22/2020   Procedure: LEFT TOTAL HIP ARTHROPLASTY ANTERIOR APPROACH;  Surgeon: 02/24/2020, MD;  Location: [facility];  Service: Orthopedics;  Laterality: Left;   TOTAL KNEE ARTHROPLASTY Right    UPPER GI ENDOSCOPY     Family History  Problem Relation Age of Onset   Arthritis Mother    Liver cancer Mother    Cancer Mother    Rheumatologic disease Mother    Prostate cancer Father        Asbestos exposure   Cancer Father    Colon cancer Father    Hypertension Sister    Hyperlipidemia Sister    Breast cancer Neg Hx    Social History   Socioeconomic History   Marital status: Married    Spouse name: Not on file   Number of children: Not on file   Years of education: Not on file   Highest education level: Not on file  Occupational History   Not on file  Tobacco Use   Smoking status: Former   Smokeless tobacco: Never  Vaping Use   Vaping Use: Never used  Substance and Sexual Activity   Alcohol use: Never   Drug use: Never   Sexual activity: Not on file  Other Topics Concern  Not on file  Social History Narrative   Not on file   Social Determinants of Health   Financial Resource Strain: Not on file  Food Insecurity: Not on file  Transportation Needs: Not on file  Physical Activity: Not on file  Stress: Not on file  Social Connections: Not on file    Tobacco Counseling Counseling given: Not Answered            Diabetic? No       Activities of Daily Living No flowsheet data found.  Patient Care Team: Dulce Sellar, NP as PCP - General (Family Medicine)  Indicate any recent Medical Services you may have received from other than [facility] providers in the past year (date may be approximate).     Assessment:   This is a routine wellness examination for [place].  Hearing/Vision screen No results found.  Dietary issues and exercise activities discussed:     Goals  Addressed   None   Depression Screen PHQ 2/9 Scores 06/24/2021  PHQ - 2 Score 0  PHQ- 9 Score 0    Fall Risk No flowsheet data found.  FALL RISK PREVENTION PERTAINING TO THE HOME:  Any stairs in or around the home? Yes - 2 steps to get into home. If so, are there any without handrails? Yes  Home free of loose throw rugs in walkways, pet beds, electrical cords, etc? Yes  Adequate lighting in your home to reduce risk of falls? Yes   ASSISTIVE DEVICES UTILIZED TO PREVENT FALLS:  Life alert? No  Use of a cane, walker or w/c? No  Grab bars in the bathroom? Yes  Shower chair or bench in shower? No  Elevated toilet seat or a handicapped toilet? Yes   TIMED UP AND GO:  Was the test performed? No .  Pt has no difficulty with ambulation.   Gait slow and steady without use of assistive device  Cognitive Function: no deficits      Immunizations Immunization History  Administered Date(s) Administered   Influenza,trivalent, recombinat, inj, PF 04/19/2014   Influenza-Unspecified 04/18/2021   Moderna SARS-COV2 Booster Vaccination 04/18/2021   Moderna Sars-Covid-2 Vaccination 08/14/2019, 09/11/2019   Tdap 03/05/2014    TDAP status: Up to date  Flu Vaccine status: Up to date  Pneumococcal vaccine status: Declined,  Education has been provided regarding the importance of this vaccine but patient still declined. Advised may receive this vaccine at local pharmacy or Health Dept. Aware to provide a copy of the vaccination record if obtained from local pharmacy or Health Dept. Verbalized acceptance and understanding.   Covid-19 vaccine status: Completed vaccines  Qualifies for Shingles Vaccine? Yes   Zostavax completed No   Shingrix Completed?: No.    Education has been provided regarding the importance of this vaccine. Patient has been advised to call insurance company to determine out of pocket expense if they have not yet received this vaccine. Advised may also receive vaccine at  local pharmacy or Health Dept. Verbalized acceptance and understanding.  Screening Tests Health Maintenance  Topic Date Due   Hepatitis C Screening  Never done   COLONOSCOPY (Pts 45-37yrs Insurance coverage will need to be confirmed)  Never done   MAMMOGRAM  Never done   Zoster Vaccines- Shingrix (1 of 2) Never done   Pneumonia Vaccine 45+ Years old (1 - PCV) Never done   DEXA SCAN  Never done   COVID-19 Vaccine (3 - Booster for Moderna series) 06/13/2021   TETANUS/TDAP  03/05/2024  INFLUENZA VACCINE  Completed   HPV VACCINES  Aged Out    Health Maintenance  Health Maintenance Due  Topic Date Due   Hepatitis C Screening  Never done   COLONOSCOPY (Pts 45-38yrs Insurance coverage will need to be confirmed)  Never done   MAMMOGRAM  Never done   Zoster Vaccines- Shingrix (1 of 2) Never done   Pneumonia Vaccine 85+ Years old (1 - PCV) Never done   DEXA SCAN  Never done   COVID-19 Vaccine (3 - Booster for Moderna series) 06/13/2021    Colorectal cancer screening: Type of screening: Colonoscopy. Completed yes. Repeat every 10 years  Mammogram status: Completed 2 years ago, refusing order today. Repeat every year  Bone Density status: Completed yes, refusing updated order today. Results reflect: Bone density results: OSTEOPENIA. Repeat every 2 years. Reports bad side effects with Fosamax. Pt followed by Rheumatology.  Lung Cancer Screening: (Low Dose CT Chest recommended if Age 61-80 years, 30 pack-year currently smoking OR have quit w/in 15years.) does not qualify.   Lung Cancer Screening Referral: None  Additional Screening:  Hepatitis C Screening: does not qualify; Completed   Vision Screening: Recommended annual ophthalmology exams for early detection of glaucoma and other disorders of the eye. Is the patient up to date with their annual eye exam?  Yes  Who is the provider or what is the name of the office in which the patient attends annual eye exams? N/A If pt is not  established with a provider, would they like to be referred to a provider to establish care? No .   Dental Screening: Recommended annual dental exams for proper oral hygiene  Community Resource Referral / Chronic Care Management: CRR required this visit?  No   CCM required this visit?  No      Plan:     I have personally reviewed and noted the following in the patient's chart:   Medical and social history Use of alcohol, tobacco or illicit drugs  Current medications and supplements including opioid prescriptions. Patient is not currently taking opioid prescriptions. Functional ability and status Nutritional status Physical activity Advanced directives List of other physicians Hospitalizations, surgeries, and ER visits in previous 12 months Vitals Screenings to include cognitive, depression, and falls Referrals and appointments  In addition, I have reviewed and discussed with patient certain preventive protocols, quality metrics, and best practice recommendations. A written personalized care plan for preventive services as well as general preventive health recommendations were provided to patient.     Dulce Sellar, NP   06/24/2021

## 2021-06-24 NOTE — Patient Instructions (Signed)
Welcome to [facility]! It was a pleasure meeting you today.    PLEASE NOTE:  If you had any LAB tests please let us know if you have not heard back within a few days. You may see your results on MyChart before we have a chance to review them but we will give you a call once they are reviewed by us. If we ordered any REFERRALS today, please let us know if you have not heard from their office within the next week.  Let us know through MyChart if you are needing REFILLS, or have your pharmacy send us the request. You can also use MyChart to communicate with me or any office staff.  Please try these tips to maintain a healthy lifestyle:  Eat most of your calories during the day when you are active. Eliminate processed foods including packaged sweets (pies, cakes, cookies), reduce intake of potatoes, white bread, white pasta, and white rice. Look for whole grain options, oat flour or almond flour.  Each meal should contain half fruits/vegetables, one quarter protein, and one quarter carbs (no bigger than a computer mouse).  Cut down on sweet beverages. This includes juice, soda, and sweet tea. Also watch fruit intake, though this is a healthier sweet option, it still contains natural sugar! Limit to 3 servings daily.  Drink at least 1 glass of water with each meal and aim for at least 8 glasses per day  Exercise at least 150 minutes every week.   

## 2021-07-31 DIAGNOSIS — M0579 Rheumatoid arthritis with rheumatoid factor of multiple sites without organ or systems involvement: Secondary | ICD-10-CM | POA: Diagnosis not present

## 2021-07-31 DIAGNOSIS — M150 Primary generalized (osteo)arthritis: Secondary | ICD-10-CM | POA: Diagnosis not present

## 2021-09-16 DIAGNOSIS — H2513 Age-related nuclear cataract, bilateral: Secondary | ICD-10-CM | POA: Diagnosis not present

## 2021-09-16 DIAGNOSIS — H5213 Myopia, bilateral: Secondary | ICD-10-CM | POA: Diagnosis not present

## 2021-10-28 DIAGNOSIS — M0579 Rheumatoid arthritis with rheumatoid factor of multiple sites without organ or systems involvement: Secondary | ICD-10-CM | POA: Diagnosis not present

## 2022-02-02 DIAGNOSIS — Z13220 Encounter for screening for lipoid disorders: Secondary | ICD-10-CM | POA: Diagnosis not present

## 2022-02-02 DIAGNOSIS — M1991 Primary osteoarthritis, unspecified site: Secondary | ICD-10-CM | POA: Diagnosis not present

## 2022-02-02 DIAGNOSIS — M0579 Rheumatoid arthritis with rheumatoid factor of multiple sites without organ or systems involvement: Secondary | ICD-10-CM | POA: Diagnosis not present

## 2022-04-13 ENCOUNTER — Telehealth: Payer: Self-pay

## 2022-04-13 NOTE — Telephone Encounter (Signed)
LMOVM to schedule AWV 

## 2022-04-19 ENCOUNTER — Encounter: Payer: Self-pay | Admitting: *Deleted

## 2022-05-05 DIAGNOSIS — Z13220 Encounter for screening for lipoid disorders: Secondary | ICD-10-CM | POA: Diagnosis not present

## 2022-05-05 DIAGNOSIS — Z79899 Other long term (current) drug therapy: Secondary | ICD-10-CM | POA: Diagnosis not present

## 2022-06-10 ENCOUNTER — Telehealth: Payer: Self-pay | Admitting: Family

## 2022-06-10 NOTE — Telephone Encounter (Signed)
Copied from CRM [id number]. Topic: Medicare AWV >> Jun 10, 2022  1:27 PM Gwenith Spitz wrote: Reason for CRM: LVM FOR PATIENT TO CALL [phone] KAREN TO SCHEDULE AWVI WITH HEALTH COACH TINA--DUE 03/26/2021 PER [place]

## 2022-07-08 ENCOUNTER — Encounter: Payer: Self-pay | Admitting: *Deleted

## 2022-08-05 DIAGNOSIS — M0579 Rheumatoid arthritis with rheumatoid factor of multiple sites without organ or systems involvement: Secondary | ICD-10-CM | POA: Diagnosis not present

## 2022-08-05 DIAGNOSIS — Z13220 Encounter for screening for lipoid disorders: Secondary | ICD-10-CM | POA: Diagnosis not present

## 2022-08-05 DIAGNOSIS — Z6821 Body mass index (BMI) 21.0-21.9, adult: Secondary | ICD-10-CM | POA: Diagnosis not present

## 2022-08-05 DIAGNOSIS — M1991 Primary osteoarthritis, unspecified site: Secondary | ICD-10-CM | POA: Diagnosis not present

## 2022-08-09 ENCOUNTER — Ambulatory Visit (INDEPENDENT_AMBULATORY_CARE_PROVIDER_SITE_OTHER): Payer: Medicare Other

## 2022-08-09 VITALS — BP 120/60 | HR 94 | Temp 97.7°F | Wt 128.2 lb

## 2022-08-09 DIAGNOSIS — Z0000 Encounter for general adult medical examination without abnormal findings: Secondary | ICD-10-CM | POA: Diagnosis not present

## 2022-08-09 NOTE — Patient Instructions (Signed)
[name] , Thank you for taking time to come for your Medicare Wellness Visit. I appreciate your ongoing commitment to your health goals. Please review the following plan we discussed and let me know if I can assist you in the future.   These are the goals we discussed:  Goals   None     This is a list of the screening recommended for you and due dates:  Health Maintenance  Topic Date Due   Hepatitis C Screening: USPSTF Recommendation to screen - Ages 65-79 yo.  Never done   Colon Cancer Screening  Never done   Mammogram  Never done   Zoster (Shingles) Vaccine (1 of 2) Never done   Pneumonia Vaccine (1 - PCV) Never done   DEXA scan (bone density measurement)  Never done   Flu Shot  02/23/2022   COVID-19 Vaccine (4 - 2023-24 season) 03/26/2022   Medicare Annual Wellness Visit  08/10/2023   DTaP/Tdap/Td vaccine (2 - Td or Tdap) 03/05/2024   HPV Vaccine  Aged Out    Advanced directives: Please bring a copy of your health care power of attorney and living will to the office at your convenience.  Conditions/risks identified: stay healthy and active   Next appointment: Follow up in one year for your annual wellness visit    Preventive Care 65 Years and Older, Female Preventive care refers to lifestyle choices and visits with your health care provider that can promote health and wellness. What does preventive care include? A yearly physical exam. This is also called an annual well check. Dental exams once or twice a year. Routine eye exams. Ask your health care provider how often you should have your eyes checked. Personal lifestyle choices, including: Daily care of your teeth and gums. Regular physical activity. Eating a healthy diet. Avoiding tobacco and drug use. Limiting alcohol use. Practicing safe sex. Taking low-dose aspirin every day. Taking vitamin and mineral supplements as recommended by your health care provider. What happens during an annual well check? The  services and screenings done by your health care provider during your annual well check will depend on your age, overall health, lifestyle risk factors, and family history of disease. Counseling  Your health care provider may ask you questions about your: Alcohol use. Tobacco use. Drug use. Emotional well-being. Home and relationship well-being. Sexual activity. Eating habits. History of falls. Memory and ability to understand (cognition). Work and work Statistician. Reproductive health. Screening  You may have the following tests or measurements: Height, weight, and BMI. Blood pressure. Lipid and cholesterol levels. These may be checked every 5 years, or more frequently if you are over 53 years old. Skin check. Lung cancer screening. You may have this screening every year starting at age 40 if you have a 30-pack-year history of smoking and currently smoke or have quit within the past 15 years. Fecal occult blood test (FOBT) of the stool. You may have this test every year starting at age 31. Flexible sigmoidoscopy or colonoscopy. You may have a sigmoidoscopy every 5 years or a colonoscopy every 10 years starting at age 54. Hepatitis C blood test. Hepatitis B blood test. Sexually transmitted disease (STD) testing. Diabetes screening. This is done by checking your blood sugar (glucose) after you have not eaten for a while (fasting). You may have this done every 1-3 years. Bone density scan. This is done to screen for osteoporosis. You may have this done starting at age 53. Mammogram. This may be done every  1-2 years. Talk to your health care provider about how often you should have regular mammograms. Talk with your health care provider about your test results, treatment options, and if necessary, the need for more tests. Vaccines  Your health care provider may recommend certain vaccines, such as: Influenza vaccine. This is recommended every year. Tetanus, diphtheria, and acellular  pertussis (Tdap, Td) vaccine. You may need a Td booster every 10 years. Zoster vaccine. You may need this after age 40. Pneumococcal 13-valent conjugate (PCV13) vaccine. One dose is recommended after age 98. Pneumococcal polysaccharide (PPSV23) vaccine. One dose is recommended after age 68. Talk to your health care provider about which screenings and vaccines you need and how often you need them. This information is not intended to replace advice given to you by your health care provider. Make sure you discuss any questions you have with your health care provider. Document Released: 08/08/2015 Document Revised: 03/31/2016 Document Reviewed: 05/13/2015 Elsevier Interactive Patient Education  2017 [employer] Prevention in the Home Falls can cause injuries. They can happen to people of all ages. There are many things you can do to make your home safe and to help prevent falls. What can I do on the outside of my home? Regularly fix the edges of walkways and driveways and fix any cracks. Remove anything that might make you trip as you walk through a door, such as a raised step or threshold. Trim any bushes or trees on the path to your home. Use bright outdoor lighting. Clear any walking paths of anything that might make someone trip, such as rocks or tools. Regularly check to see if handrails are loose or broken. Make sure that both sides of any steps have handrails. Any raised decks and porches should have guardrails on the edges. Have any leaves, snow, or ice cleared regularly. Use sand or salt on walking paths during winter. Clean up any spills in your garage right away. This includes oil or grease spills. What can I do in the bathroom? Use night lights. Install grab bars by the toilet and in the tub and shower. Do not use towel bars as grab bars. Use non-skid mats or decals in the tub or shower. If you need to sit down in the shower, use a plastic, non-slip stool. Keep the floor  dry. Clean up any water that spills on the floor as soon as it happens. Remove soap buildup in the tub or shower regularly. Attach bath mats securely with double-sided non-slip rug tape. Do not have throw rugs and other things on the floor that can make you trip. What can I do in the bedroom? Use night lights. Make sure that you have a light by your bed that is easy to reach. Do not use any sheets or blankets that are too big for your bed. They should not hang down onto the floor. Have a firm chair that has side arms. You can use this for support while you get dressed. Do not have throw rugs and other things on the floor that can make you trip. What can I do in the kitchen? Clean up any spills right away. Avoid walking on wet floors. Keep items that you use a lot in easy-to-reach places. If you need to reach something above you, use a strong step stool that has a grab bar. Keep electrical cords out of the way. Do not use floor polish or wax that makes floors slippery. If you must use wax, use non-skid  floor wax. Do not have throw rugs and other things on the floor that can make you trip. What can I do with my stairs? Do not leave any items on the stairs. Make sure that there are handrails on both sides of the stairs and use them. Fix handrails that are broken or loose. Make sure that handrails are as long as the stairways. Check any carpeting to make sure that it is firmly attached to the stairs. Fix any carpet that is loose or worn. Avoid having throw rugs at the top or bottom of the stairs. If you do have throw rugs, attach them to the floor with carpet tape. Make sure that you have a light switch at the top of the stairs and the bottom of the stairs. If you do not have them, ask someone to add them for you. What else can I do to help prevent falls? Wear shoes that: Do not have high heels. Have rubber bottoms. Are comfortable and fit you well. Are closed at the toe. Do not wear  sandals. If you use a stepladder: Make sure that it is fully opened. Do not climb a closed stepladder. Make sure that both sides of the stepladder are locked into place. Ask someone to hold it for you, if possible. Clearly mark and make sure that you can see: Any grab bars or handrails. First and last steps. Where the edge of each step is. Use tools that help you move around (mobility aids) if they are needed. These include: Canes. Walkers. Scooters. Crutches. Turn on the lights when you go into a dark area. Replace any light bulbs as soon as they burn out. Set up your furniture so you have a clear path. Avoid moving your furniture around. If any of your floors are uneven, fix them. If there are any pets around you, be aware of where they are. Review your medicines with your doctor. Some medicines can make you feel dizzy. This can increase your chance of falling. Ask your doctor what other things that you can do to help prevent falls. This information is not intended to replace advice given to you by your health care provider. Make sure you discuss any questions you have with your health care provider. Document Released: 05/08/2009 Document Revised: 12/18/2015 Document Reviewed: 08/16/2014 Elsevier Interactive Patient Education  2017 [employer].

## 2022-08-09 NOTE — Progress Notes (Signed)
Subjective:   [name] is a 67 y.o. female who presents for an Initial Medicare Annual Wellness Visit.  Review of Systems     Cardiac Risk Factors include: advanced age (>36men, >50 women)     Objective:    Today's Vitals   08/09/22 0819  BP: 120/60  Pulse: 94  Temp: 97.7 F (36.5 C)  SpO2: 99%  Weight: 128 lb 3.2 oz (58.2 kg)   Body mass index is 22.71 kg/m.     08/09/2022    8:27 AM 02/22/2020   12:41 PM 02/12/2020    9:48 AM  Advanced Directives  Does Patient Have a Medical Advance Directive? Yes Yes Yes  Type of Paramedic of [place];Living will [place];Living will [place];Living will  Does patient want to make changes to medical advance directive?  No - Patient declined No - Patient declined  Copy of [place] in Chart? No - copy requested No - copy requested No - copy requested    Current Medications (verified) Outpatient Encounter Medications as of 08/09/2022  Medication Sig   ascorbic acid (VITAMIN C) 500 MG tablet Take 500 mg by mouth daily.   Cholecalciferol (D3-50 PO) Take 50 mg by mouth 2 (two) times daily.   FLUAD QUADRIVALENT 0.5 ML injection    folic acid (FOLVITE) 1 MG tablet Take 1 mg by mouth daily.   methotrexate 2.5 MG tablet Take 5 mg by mouth 2 (two) times a week.   No facility-administered encounter medications on file as of 08/09/2022.    Allergies (verified) Patient has no known allergies.   History: Past Medical History:  Diagnosis Date   Carpal tunnel syndrome, right    History of   History of blood transfusion    12 years ago   Iron deficiency anemia    RA (rheumatoid arthritis) ([place])    Past Surgical History:  Procedure Laterality Date   CARPAL TUNNEL RELEASE Right 04/1990   CESAREAN SECTION     x2   COLONOSCOPY     TONSILLECTOMY     TOTAL HIP ARTHROPLASTY Right 02/2019   TOTAL HIP ARTHROPLASTY Left 02/22/2020   Procedure: LEFT  TOTAL HIP ARTHROPLASTY ANTERIOR APPROACH;  Surgeon: Mcarthur Rossetti, MD;  Location: [facility];  Service: Orthopedics;  Laterality: Left;   TOTAL KNEE ARTHROPLASTY Right    UPPER GI ENDOSCOPY     Family History  Problem Relation Age of Onset   Arthritis Mother    Liver cancer Mother    Cancer Mother    Rheumatologic disease Mother    Prostate cancer Father        Asbestos exposure   Cancer Father    Colon cancer Father    Hypertension Sister    Hyperlipidemia Sister    Breast cancer Neg Hx    Social History   Socioeconomic History   Marital status: Married    Spouse name: Not on file   Number of children: Not on file   Years of education: Not on file   Highest education level: Not on file  Occupational History   Not on file  Tobacco Use   Smoking status: Former   Smokeless tobacco: Never  Vaping Use   Vaping Use: Never used  Substance and Sexual Activity   Alcohol use: Never   Drug use: Never   Sexual activity: Not on file  Other Topics Concern   Not on file  Social History Narrative  Not on file   Social Determinants of Health   Financial Resource Strain: Not on file  Food Insecurity: Not on file  Transportation Needs: Not on file  Physical Activity: Not on file  Stress: No Stress Concern Present (08/09/2022)   [employer] of Occupational Health - Occupational Stress Questionnaire    Feeling of Stress : Not at all  Social Connections: Unknown (08/08/2022)   Social Connection and Isolation Panel [NHANES]    Frequency of Communication with Friends and Family: Not on file    Frequency of Social Gatherings with Friends and Family: Not on file    Attends Religious Services: More than 4 times per year    Active Member of [employer] or Organizations: Not on file    Attends Banker Meetings: Not on file    Marital Status: Not on file    Tobacco Counseling Counseling given: Not Answered   Clinical Intake:  Pre-visit preparation completed:  Yes  Pain : No/denies pain     Nutritional Risks: None Diabetes: No  How often do you need to have someone help you when you read instructions, pamphlets, or other written materials from your doctor or pharmacy?: 1 - Never  Diabetic?no  Interpreter Needed?: No  Information entered by :: Lanier Ensign, LPN   Activities of Daily Living    08/08/2022    8:18 PM  In your present state of health, do you have any difficulty performing the following activities:  Hearing? 0  Vision? 0  Difficulty concentrating or making decisions? 0  Walking or climbing stairs? 0  Dressing or bathing? 0  Doing errands, shopping? 0  Preparing Food and eating ? N  Using the Toilet? N  In the past six months, have you accidently leaked urine? N  Do you have problems with loss of bowel control? N  Managing your Medications? N  Managing your Finances? N  Housekeeping or managing your Housekeeping? N    Patient Care Team: Dulce Sellar, NP as PCP - General (Family Medicine)  Indicate any recent Medical Services you may have received from other than [facility] providers in the past year (date may be approximate).     Assessment:   This is a routine wellness examination for [place].  Hearing/Vision screen Hearing Screening - Comments:: Pt denies any hearing issues  Vision Screening - Comments:: Pt follows up with Dr Dione Booze for annual eye exams   Dietary issues and exercise activities discussed: Current Exercise Habits: Home exercise routine, Type of exercise: walking, Time (Minutes): 20, Frequency (Times/Week): 5, Weekly Exercise (Minutes/Week): 100   Goals Addressed             This Visit's Progress    Patient Stated       Stay healthy and active        Depression Screen    08/09/2022    8:29 AM 06/24/2021    9:04 AM  PHQ 2/9 Scores  PHQ - 2 Score 0 0  PHQ- 9 Score  0    Fall Risk    08/08/2022    8:18 PM  Fall Risk   Falls in the past year? 0  Number falls in past yr:  0  Injury with Fall? 0  Risk for fall due to : Impaired vision  Follow up Falls prevention discussed    FALL RISK PREVENTION PERTAINING TO THE HOME:  Any stairs in or around the home? Yes  If so, are there any without handrails? No  Home free  of loose throw rugs in walkways, pet beds, electrical cords, etc? Yes  Adequate lighting in your home to reduce risk of falls? Yes   ASSISTIVE DEVICES UTILIZED TO PREVENT FALLS:  Life alert? No  Use of a cane, walker or w/c? No  Grab bars in the bathroom? Yes  Shower chair or bench in shower? Yes  Elevated toilet seat or a handicapped toilet? No   TIMED UP AND GO:  Was the test performed? Yes .  Length of time to ambulate 10 feet: 10 sec.   Gait steady and fast without use of assistive device  Cognitive Function:        08/09/2022    8:31 AM  6CIT Screen  What Year? 0 points  What month? 0 points  What time? 0 points  Count back from 20 0 points  Months in reverse 0 points  Repeat phrase 0 points  Total Score 0 points    Immunizations Immunization History  Administered Date(s) Administered   Influenza,trivalent, recombinat, inj, PF 04/19/2014   Influenza-Unspecified 04/18/2021, 05/01/2022   Moderna SARS-COV2 Booster Vaccination 04/18/2021   Moderna Sars-Covid-2 Vaccination 08/14/2019, 09/11/2019   Tdap 03/05/2014    TDAP status: Up to date  Flu Vaccine status: Up to date  Pneumococcal vaccine status: Declined,  Education has been provided regarding the importance of this vaccine but patient still declined. Advised may receive this vaccine at local pharmacy or Health Dept. Aware to provide a copy of the vaccination record if obtained from local pharmacy or Health Dept. Verbalized acceptance and understanding.   Covid-19 vaccine status: Completed vaccines  Qualifies for Shingles Vaccine? Yes   Zostavax completed No   Shingrix Completed?: No.    Education has been provided regarding the importance of this vaccine.  Patient has been advised to call insurance company to determine out of pocket expense if they have not yet received this vaccine. Advised may also receive vaccine at local pharmacy or Health Dept. Verbalized acceptance and understanding.  Screening Tests Health Maintenance  Topic Date Due   Hepatitis C Screening  Never done   COLONOSCOPY (Pts 45-108yrs Insurance coverage will need to be confirmed)  Never done   MAMMOGRAM  Never done   DEXA SCAN  Never done   COVID-19 Vaccine (4 - 2023-24 season) 08/25/2022 (Originally 03/26/2022)   Zoster Vaccines- Shingrix (1 of 2) 11/08/2022 (Originally 02/23/2004)   Pneumonia Vaccine 2+ Years old (1 - PCV) 08/10/2023 (Originally 02/23/2019)   Medicare Annual Wellness (AWV)  08/10/2023   DTaP/Tdap/Td (2 - Td or Tdap) 03/05/2024   INFLUENZA VACCINE  Completed   HPV VACCINES  Aged Out    Health Maintenance  Health Maintenance Due  Topic Date Due   Hepatitis C Screening  Never done   COLONOSCOPY (Pts 45-42yrs Insurance coverage will need to be confirmed)  Never done   MAMMOGRAM  Never done   DEXA SCAN  Never done    Pt will get dates of colonoscopy completed in [place]   Mammogram and bone scan pt will confer with insurance and make a appt     Lung Cancer Screening: (Low Dose CT Chest recommended if Age 59-80 years, 30 pack-year currently smoking OR have quit w/in 15years.) does not qualify.    Additional Screening:  Hepatitis C Screening: does qualify;   Vision Screening: Recommended annual ophthalmology exams for early detection of glaucoma and other disorders of the eye. Is the patient up to date with their annual eye exam?  Yes  Who is the provider or what is the name of the office in which the patient attends annual eye exams? Dr Dione Booze  If pt is not established with a provider, would they like to be referred to a provider to establish care? No .   Dental Screening: Recommended annual dental exams for proper oral hygiene  Community  Resource Referral / Chronic Care Management: CRR required this visit?  No   CCM required this visit?  No      Plan:     I have personally reviewed and noted the following in the patient's chart:   Medical and social history Use of alcohol, tobacco or illicit drugs  Current medications and supplements including opioid prescriptions. Patient is not currently taking opioid prescriptions. Functional ability and status Nutritional status Physical activity Advanced directives List of other physicians Hospitalizations, surgeries, and ER visits in previous 12 months Vitals Screenings to include cognitive, depression, and falls Referrals and appointments  In addition, I have reviewed and discussed with patient certain preventive protocols, quality metrics, and best practice recommendations. A written personalized care plan for preventive services as well as general preventive health recommendations were provided to patient.     Marzella Schlein, LPN   0/16/0109   Nurse Notes: none

## 2022-08-12 ENCOUNTER — Ambulatory Visit: Payer: Medicare Other

## 2022-11-04 DIAGNOSIS — M0579 Rheumatoid arthritis with rheumatoid factor of multiple sites without organ or systems involvement: Secondary | ICD-10-CM | POA: Diagnosis not present

## 2023-02-01 DIAGNOSIS — M0579 Rheumatoid arthritis with rheumatoid factor of multiple sites without organ or systems involvement: Secondary | ICD-10-CM | POA: Diagnosis not present

## 2023-02-01 DIAGNOSIS — M1991 Primary osteoarthritis, unspecified site: Secondary | ICD-10-CM | POA: Diagnosis not present

## 2023-02-01 DIAGNOSIS — R636 Underweight: Secondary | ICD-10-CM | POA: Diagnosis not present

## 2023-02-01 DIAGNOSIS — Z13220 Encounter for screening for lipoid disorders: Secondary | ICD-10-CM | POA: Diagnosis not present

## 2023-02-01 DIAGNOSIS — Z6821 Body mass index (BMI) 21.0-21.9, adult: Secondary | ICD-10-CM | POA: Diagnosis not present

## 2023-05-04 DIAGNOSIS — M0579 Rheumatoid arthritis with rheumatoid factor of multiple sites without organ or systems involvement: Secondary | ICD-10-CM | POA: Diagnosis not present

## 2023-06-20 ENCOUNTER — Other Ambulatory Visit (INDEPENDENT_AMBULATORY_CARE_PROVIDER_SITE_OTHER): Payer: Medicare Other

## 2023-06-20 ENCOUNTER — Ambulatory Visit: Payer: Medicare Other | Admitting: Orthopaedic Surgery

## 2023-06-20 DIAGNOSIS — Z96642 Presence of left artificial hip joint: Secondary | ICD-10-CM

## 2023-06-20 NOTE — Progress Notes (Signed)
The patient is well-known to me.  We replaced her left hip almost 3-1/2 years ago through a direct anterior approach.  She has had a previous right hip replacement I believe that was done posteriorly.  She is then an incredibly active is 5.  She has been having left hip pain and is right at the groin area of her hip where she is hurting.  There are certain activities that cause this pain to worsen.  The way she describes it and where she points that may be an issue of the psoas or iliopsoas tendon.  She denies any fever and chills.  She denies any disruption of her activities.  On exam she is definitely consistent with the pain that she is feeling with hip flexion and it seems to be related to the iliopsoas tendon.  An AP pelvis and lateral left hip shows a well-seated total hip arthroplasty on the left side and the right side.  The acetabular component does not appear to be overriding.  It is well-seated within the acetabulum.  I do not see any complicating features of the implant itself.  There is no cortical irregularities around the lesser trochanter.  I would actually like to send her to my partner Dr. Shon Baton so he can assess her left hip under ultrasound and likely provide some type of injection in the iliopsoas tendon.  She agrees with this treatment plan.  We can then get her back to me 2 to 4 weeks later and we can see if this has helped.

## 2023-06-28 ENCOUNTER — Other Ambulatory Visit: Payer: Self-pay

## 2023-06-28 ENCOUNTER — Encounter: Payer: Self-pay | Admitting: Sports Medicine

## 2023-06-28 ENCOUNTER — Ambulatory Visit: Payer: Medicare Other | Admitting: Sports Medicine

## 2023-06-28 DIAGNOSIS — Z96642 Presence of left artificial hip joint: Secondary | ICD-10-CM | POA: Diagnosis not present

## 2023-06-28 DIAGNOSIS — M25552 Pain in left hip: Secondary | ICD-10-CM

## 2023-06-28 DIAGNOSIS — M7612 Psoas tendinitis, left hip: Secondary | ICD-10-CM

## 2023-06-28 NOTE — Progress Notes (Signed)
Office Visit Note   Patient: [name]           Date of Birth: [birth year]           MRN: [id number] Visit Date: 06/28/2023              Requested by: Dulce Sellar, NP [address] [place],  [place] [address] PCP: Dulce Sellar, NP  Medical Resident, Sports Medicine Fellow - Attending Physician Addendum:   I have independently interviewed and examined the patient myself. I have discussed the above with the original author and agree with their documentation. My edits for correction/addition/clarification have been made, see any changes above and below.   In summary, a very pleasant 67 year old female with left anterior hip pain in the setting of a left hip replacement about 3.5 years ago.  Her exam suggest anterior hip flexor pathology with a limited diagnostic ultrasound that shows a degree of bursitis around the iliopsoas tendon without evidence of tearing.  Through shared decision making, proceeded with both diagnostic and hopefully therapeutic ultrasound-guided iliopsoas tendon sheath injection. Patient had significant relief of her pain and no pain with hip flexion minutes following the procedure. She will follow-up with Dr. Magnus Ivan for evaluation in about 2-3 weeks to discuss next steps. Ok for ice/heat or tylenol for any post-injection pain; recommended 48 hours of modified activity post-injection.  Madelyn Brunner, DO Primary Care Sports Medicine Physician  [place] [facility] - Orthopedics   Assessment & Plan: Visit Diagnoses:  1. Pain in left hip   2. Psoas tendonitis of left side   3. History of left hip replacement    Plan: Patient's physical exam and ultrasound do point to this being a hip flexor issue likely within the psoas tendon.  There is some bursal swelling on ultrasound of the psoas tendon itself, but no evidence of high-grade tearing.  Discussed with patient about her options and patient would like to go ahead with a steroid injection around the  affected tendon.  Patient was able to tolerate the procedure well under ultrasound-guidance without any difficulty.  At this time, patient was advised to take it easy for the next 48 hours and to reevaluate how she is doing by the end of the week.  Patient did not note significant improvement of her pain with hip flexion immediately after the injection.  Patient was advised to follow-up in 2 to 3 weeks with Dr. Magnus Ivan and can follow-up in our clinic as needed for any other issues if needed.  Patient understanding and agreeable with plan.  Follow-Up Instructions: Return for f/u in 2-3 weeks with DR. Magnus Ivan for R-hip .   Orders:  Orders Placed This Encounter  Procedures   [place] Extrem Low Left Ltd   No orders of the defined types were placed in this encounter.   Procedures:  US-guided Iliopsoas Tendon Sheath Injection, Left Hip: After discussion on risk/benefits/indications, an informed verbal consent was obtained. A timeout was then performed. The patient was lying supine on examination table with the affected leg relaxed in neutral position. The area overlying the groin and psoas tendon was prepped with ChloraPrep and multiple alcohol swabs. The ultrasound probe was placed in an oblique plane parallel to the inguinal ligament and superior to femoral head. The overlying soft tissue was anesthesized with 3cc of lidocaine 1%. Using ultrasound guidance via an in-plane approach, a 22-gauge, 3.5" needle was inserted from a lateral to medial direction into the iliopsoas tendon sheath between the tendon and  ilium. The tendon sheath was then injected with a mixture of 2:2:1cc of lidocaine:bupivicaine:celestone. Appropriate spread of the injectate within the tendon sheath was visualized with ultrasound guidance. Patient tolerated the procedure well without immediate complications.  A Band-Aid was then applied.     Clinical Data: No additional findings.   Subjective: Chief Complaint  Patient presents  with   Left Hip - Pain    Patient is presenting for some left-sided hip pain.  Patient had her hip replaced by Dr. Magnus Ivan 3-1/2 years ago.  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  Patient's been having left hip pain that starts at her groin area whenever she flexes the hip.  Patient states that she has difficulty getting in and out of her car and has to lift her leg up in order to get out.  Patient states that the pain came on relatively suddenly and she does not remember any inciting event.  Patient denies any numbness tingling down the leg.  Patient is otherwise doing well and states that she has no pain when she is at rest.  Patient's only pain occurs whenever she tries to flex the hip in someway    Review of Systems   Objective:  Physical Exam  Left Hip: Inspection reveals a surgical scar on the anterior aspect of the hip, no swelling, no erythema or warmth noted.  There is tenderness to palpation over the iliopsoas tendon as well as some tenderness to palpation over the medial side of the thigh and the adductor region.  Range of motion is decreased with hip flexion due to pain, strength is decreased against active resistance with hip flexion.  Ortho Exam  Specialty Comments:  No specialty comments available.  Imaging: [place] Extrem Low Left Ltd  Result Date: 06/28/2023 Ultrasound left hip: The rectus femoris muscle appears appropriate without any hypoechoic changes, this is continued all the way until its insertion on the AIIS.  There appears to be no disruption of the fibers on its insertion on the AIIS. The sartorius appears appropriate in its muscle belly as well as its the tendon attachment on the ASIS.  No hypoechoic changes or tears noted of the sartorius muscle itself or its attachment site. The psoas tendon appears appropriate without any tearing, the tendon itself has appropriate hypoechogenicity.   There is some bursal swelling on the superior aspect of the psoas tendon itself.    PMFS History: Patient Active Problem List   Diagnosis Date Noted   Encounter for Medicare annual wellness exam 06/24/2021   Status post total replacement of left hip 02/22/2020   Rheumatoid arthritis (HCC) 01/04/2020   Unilateral primary osteoarthritis, left hip 01/01/2020   Past Medical History:  Diagnosis Date   Carpal tunnel syndrome, right    History of   History of blood transfusion    12 years ago   Iron deficiency anemia    RA (rheumatoid arthritis) ([facility])     Family History  Problem Relation Age of Onset   Arthritis Mother    Liver cancer Mother    Cancer Mother    Rheumatologic disease Mother    Prostate cancer Father        Asbestos exposure   Cancer Father    Colon cancer Father    Hypertension Sister    Hyperlipidemia Sister    Breast cancer Neg Hx     Past Surgical History:  Procedure Laterality Date   CARPAL TUNNEL RELEASE Right 04/1990   CESAREAN SECTION     x2   COLONOSCOPY     TONSILLECTOMY     TOTAL HIP ARTHROPLASTY Right 02/2019   TOTAL HIP ARTHROPLASTY Left 02/22/2020   Procedure: LEFT TOTAL HIP ARTHROPLASTY ANTERIOR APPROACH;  Surgeon: Kathryne Hitch, MD;  Location: [facility];  Service: Orthopedics;  Laterality: Left;   TOTAL KNEE ARTHROPLASTY Right    UPPER GI ENDOSCOPY     Social History   Occupational History   Not on file  Tobacco Use   Smoking status: Former   Smokeless tobacco: Never  Vaping Use   Vaping status: Never Used  Substance and Sexual Activity   Alcohol use: Never   Drug use: Never   Sexual activity: Not on file

## 2023-06-28 NOTE — Progress Notes (Signed)
Patient says that she has pain with getting into and out of the car; she has to lift her leg with her hands rather than using just her leg.

## 2023-08-03 ENCOUNTER — Encounter: Payer: Medicare Other | Admitting: Orthopaedic Surgery

## 2023-08-04 DIAGNOSIS — M1991 Primary osteoarthritis, unspecified site: Secondary | ICD-10-CM | POA: Diagnosis not present

## 2023-08-04 DIAGNOSIS — Z6822 Body mass index (BMI) 22.0-22.9, adult: Secondary | ICD-10-CM | POA: Diagnosis not present

## 2023-08-04 DIAGNOSIS — M0579 Rheumatoid arthritis with rheumatoid factor of multiple sites without organ or systems involvement: Secondary | ICD-10-CM | POA: Diagnosis not present

## 2023-08-04 DIAGNOSIS — Z79899 Other long term (current) drug therapy: Secondary | ICD-10-CM | POA: Diagnosis not present

## 2023-08-04 DIAGNOSIS — R7309 Other abnormal glucose: Secondary | ICD-10-CM | POA: Diagnosis not present

## 2023-08-05 LAB — LAB REPORT - SCANNED
A1c: 5.6
EGFR: 95

## 2023-09-12 ENCOUNTER — Ambulatory Visit (INDEPENDENT_AMBULATORY_CARE_PROVIDER_SITE_OTHER): Payer: Medicare Other

## 2023-09-12 VITALS — Wt 128.0 lb

## 2023-09-12 DIAGNOSIS — Z0000 Encounter for general adult medical examination without abnormal findings: Secondary | ICD-10-CM

## 2023-09-12 NOTE — Patient Instructions (Signed)
 Ms. Colonna , Thank you for taking time to come for your Medicare Wellness Visit. I appreciate your ongoing commitment to your health goals. Please review the following plan we discussed and let me know if I can assist you in the future.   Referrals/Orders/Follow-Ups/Clinician Recommendations: maintain health and activity   This is a list of the screening recommended for you and due dates:  Health Maintenance  Topic Date Due   Hepatitis C Screening  Never done   Zoster (Shingles) Vaccine (1 of 2) Never done   Colon Cancer Screening  Never done   Mammogram  Never done   Pneumonia Vaccine (1 of 1 - PCV) Never done   DEXA scan (bone density measurement)  Never done   COVID-19 Vaccine (3 - Moderna risk series) 05/16/2021   DTaP/Tdap/Td vaccine (2 - Td or Tdap) 03/05/2024   Medicare Annual Wellness Visit  09/11/2024   Flu Shot  Completed   HPV Vaccine  Aged Out    Advanced directives: (Copy Requested) Please bring a copy of your health care power of attorney and living will to the office to be added to your chart at your convenience.  Next Medicare Annual Wellness Visit scheduled for next year: Yes

## 2023-09-12 NOTE — Progress Notes (Signed)
 Subjective:   [name] is a 68 y.o. female who presents for an Initial Medicare Annual Wellness Visit.  Visit Complete: Virtual I connected with  [name] on 09/12/23 by a audio enabled telemedicine application and verified that I am speaking with the correct person using two identifiers.  Patient Location: Home  Provider Location: Office/Clinic  I discussed the limitations of evaluation and management by telemedicine. The patient expressed understanding and agreed to proceed.  Vital Signs: Because this visit was a virtual/telehealth visit, some criteria may be missing or patient reported. Any vitals not documented were not able to be obtained and vitals that have been documented are patient reported.   Cardiac Risk Factors include: advanced age (>18men, >26 women)     Objective:    Today's Vitals   09/12/23 0847  Weight: 128 lb (58.1 kg)   Body mass index is 22.67 kg/m.     09/12/2023    8:51 AM 08/09/2022    8:27 AM 02/22/2020   12:41 PM 02/12/2020    9:48 AM  Advanced Directives  Does Patient Have a Medical Advance Directive? Yes Yes Yes Yes  Type of Estate agent of [place];Living will Healthcare Power of [place];Living will Healthcare Power of [place];Living will Healthcare Power of [place];Living will  Does patient want to make changes to medical advance directive?   No - Patient declined No - Patient declined  Copy of Healthcare Power of Attorney in Chart? No - copy requested No - copy requested No - copy requested No - copy requested    Current Medications (verified) Outpatient Encounter Medications as of 09/12/2023  Medication Sig   ascorbic acid (VITAMIN C) 500 MG tablet Take 500 mg by mouth daily.   Cholecalciferol (D3-50 PO) Take 50 mg by mouth 2 (two) times daily.   folic acid (FOLVITE) 1 MG tablet Take 1 mg by mouth daily.   methotrexate 2.5 MG tablet Take 5 mg by mouth 2 (two) times a week.   FLUAD QUADRIVALENT 0.5 ML  injection    No facility-administered encounter medications on file as of 09/12/2023.    Allergies (verified) Patient has no known allergies.   History: Past Medical History:  Diagnosis Date   Carpal tunnel syndrome, right    History of   History of blood transfusion    12 years ago   Iron deficiency anemia    RA (rheumatoid arthritis) (HCC)    Past Surgical History:  Procedure Laterality Date   CARPAL TUNNEL RELEASE Right 04/1990   CESAREAN SECTION     x2   COLONOSCOPY     TONSILLECTOMY     TOTAL HIP ARTHROPLASTY Right 02/2019   TOTAL HIP ARTHROPLASTY Left 02/22/2020   Procedure: LEFT TOTAL HIP ARTHROPLASTY ANTERIOR APPROACH;  Surgeon: Kathryne Hitch, MD;  Location: [facility];  Service: Orthopedics;  Laterality: Left;   TOTAL KNEE ARTHROPLASTY Right    UPPER GI ENDOSCOPY     Family History  Problem Relation Age of Onset   Arthritis Mother    Liver cancer Mother    Cancer Mother    Rheumatologic disease Mother    Prostate cancer Father        Asbestos exposure   Cancer Father    Colon cancer Father    Hypertension Sister    Hyperlipidemia Sister    Breast cancer Neg Hx    Social History   Socioeconomic History   Marital status: Married    Spouse name: Not on file  Number of children: Not on file   Years of education: Not on file   Highest education level: Not on file  Occupational History   Not on file  Tobacco Use   Smoking status: Former   Smokeless tobacco: Never  Vaping Use   Vaping status: Never Used  Substance and Sexual Activity   Alcohol use: Never   Drug use: Never   Sexual activity: Not on file  Other Topics Concern   Not on file  Social History Narrative   Not on file   Social Drivers of Health   Financial Resource Strain: Low Risk  (09/12/2023)   Overall Financial Resource Strain (CARDIA)    Difficulty of Paying Living Expenses: Not hard at all  Food Insecurity: No Food Insecurity (09/12/2023)   Hunger Vital Sign    Worried  About Running Out of Food in the Last Year: Never true    Ran Out of Food in the Last Year: Never true  Transportation Needs: No Transportation Needs (09/12/2023)   PRAPARE - Administrator, Civil Service (Medical): No    Lack of Transportation (Non-Medical): No  Physical Activity: Sufficiently Active (09/12/2023)   Exercise Vital Sign    Days of Exercise per Week: 5 days    Minutes of Exercise per Session: 120 min  Stress: No Stress Concern Present (09/12/2023)   [employer] of Occupational Health - Occupational Stress Questionnaire    Feeling of Stress : Not at all  Social Connections: Moderately Integrated (09/12/2023)   Social Connection and Isolation Panel [NHANES]    Frequency of Communication with Friends and Family: More than three times a week    Frequency of Social Gatherings with Friends and Family: More than three times a week    Attends Religious Services: More than 4 times per year    Active Member of [employer] or Organizations: No    Attends Engineer, structural: Never    Marital Status: Married    Tobacco Counseling Counseling given: Not Answered   Clinical Intake:  Pre-visit preparation completed: Yes  Pain : No/denies pain     Nutritional Risks: None Diabetes: No  How often do you need to have someone help you when you read instructions, pamphlets, or other written materials from your doctor or pharmacy?: 1 - Never  Interpreter Needed?: No  Information entered by :: Lanier Ensign, LPN   Activities of Daily Living    09/12/2023    8:48 AM  In your present state of health, do you have any difficulty performing the following activities:  Hearing? 0  Vision? 0  Difficulty concentrating or making decisions? 0  Walking or climbing stairs? 0  Dressing or bathing? 0  Doing errands, shopping? 0  Preparing Food and eating ? N  Using the Toilet? N  In the past six months, have you accidently leaked urine? N  Do you have problems with  loss of bowel control? N  Managing your Medications? N  Managing your Finances? N  Housekeeping or managing your Housekeeping? N    Patient Care Team: Dulce Sellar, NP as PCP - General (Family Medicine)  Indicate any recent Medical Services you may have received from other than [facility] providers in the past year (date may be approximate).     Assessment:   This is a routine wellness examination for [place].  Hearing/Vision screen Hearing Screening - Comments:: Pt denies any hearing issues  Vision Screening - Comments:: Pt follows up with dr Dione Booze for  annul eye exams    Goals Addressed             This Visit's Progress    Patient Stated       Maintain health and activity        Depression Screen    09/12/2023    8:50 AM 08/09/2022    8:29 AM 06/24/2021    9:04 AM  PHQ 2/9 Scores  PHQ - 2 Score 0 0 0  PHQ- 9 Score   0    Fall Risk    09/12/2023    8:51 AM 08/08/2022    8:18 PM  Fall Risk   Falls in the past year? 0 0  Number falls in past yr: 0 0  Injury with Fall? 0 0  Risk for fall due to : No Fall Risks Impaired vision  Follow up Falls prevention discussed;Falls evaluation completed Falls prevention discussed    MEDICARE RISK AT HOME: Medicare Risk at Home Any stairs in or around the home?: No If so, are there any without handrails?: No Home free of loose throw rugs in walkways, pet beds, electrical cords, etc?: Yes Adequate lighting in your home to reduce risk of falls?: Yes Life alert?: No Use of a cane, walker or w/c?: No Grab bars in the bathroom?: Yes Shower chair or bench in shower?: Yes Elevated toilet seat or a handicapped toilet?: No  TIMED UP AND GO:  Was the test performed? No    Cognitive Function:        09/12/2023    8:59 AM 08/09/2022    8:31 AM  6CIT Screen  What Year? 0 points 0 points  What month? 0 points 0 points  What time? 0 points 0 points  Count back from 20 0 points 0 points  Months in reverse 0 points 0 points   Repeat phrase 0 points 0 points  Total Score 0 points 0 points    Immunizations Immunization History  Administered Date(s) Administered   Influenza,trivalent, recombinat, inj, PF 04/19/2014   Influenza-Unspecified 04/18/2021, 05/01/2022, 04/26/2023   Moderna SARS-COV2 Booster Vaccination 04/18/2021   Moderna Sars-Covid-2 Vaccination 08/14/2019, 09/11/2019   Tdap 03/05/2014    TDAP status: Up to date  Flu Vaccine status: Up to date  Pneumococcal vaccine status: Declined,  Education has been provided regarding the importance of this vaccine but patient still declined. Advised may receive this vaccine at local pharmacy or Health Dept. Aware to provide a copy of the vaccination record if obtained from local pharmacy or Health Dept. Verbalized acceptance and understanding.   Covid-19 vaccine status: Information provided on how to obtain vaccines.   Qualifies for Shingles Vaccine? Yes   Zostavax completed No   Shingrix Completed?: No.    Education has been provided regarding the importance of this vaccine. Patient has been advised to call insurance company to determine out of pocket expense if they have not yet received this vaccine. Advised may also receive vaccine at local pharmacy or Health Dept. Verbalized acceptance and understanding.  Screening Tests Health Maintenance  Topic Date Due   Hepatitis C Screening  Never done   Zoster Vaccines- Shingrix (1 of 2) Never done   Colonoscopy  Never done   MAMMOGRAM  Never done   Pneumonia Vaccine 55+ Years old (1 of 1 - PCV) Never done   DEXA SCAN  Never done   COVID-19 Vaccine (3 - Moderna risk series) 05/16/2021   DTaP/Tdap/Td (2 - Td or Tdap) 03/05/2024   Medicare  Annual Wellness (AWV)  09/11/2024   INFLUENZA VACCINE  Completed   HPV VACCINES  Aged Out    Health Maintenance  Health Maintenance Due  Topic Date Due   Hepatitis C Screening  Never done   Zoster Vaccines- Shingrix (1 of 2) Never done   Colonoscopy  Never done    MAMMOGRAM  Never done   Pneumonia Vaccine 20+ Years old (1 of 1 - PCV) Never done   DEXA SCAN  Never done   COVID-19 Vaccine (3 - Moderna risk series) 05/16/2021        Dexa scan , Mammogram and colonoscopy pt declined at this time will discuss further with PCP    Additional Screening:  Hepatitis C Screening: does qualify;   Vision Screening: Recommended annual ophthalmology exams for early detection of glaucoma and other disorders of the eye. Is the patient up to date with their annual eye exam?  Yes  Who is the provider or what is the name of the office in which the patient attends annual eye exams? Dr Dione Booze  If pt is not established with a provider, would they like to be referred to a provider to establish care? No .   Dental Screening: Recommended annual dental exams for proper oral hygiene   Community Resource Referral / Chronic Care Management: CRR required this visit?  No   CCM required this visit?  No     Plan:     I have personally reviewed and noted the following in the patient's chart:   Medical and social history Use of alcohol, tobacco or illicit drugs  Current medications and supplements including opioid prescriptions. Patient is not currently taking opioid prescriptions. Functional ability and status Nutritional status Physical activity Advanced directives List of other physicians Hospitalizations, surgeries, and ER visits in previous 12 months Vitals Screenings to include cognitive, depression, and falls Referrals and appointments  In addition, I have reviewed and discussed with patient certain preventive protocols, quality metrics, and best practice recommendations. A written personalized care plan for preventive services as well as general preventive health recommendations were provided to patient.     Marzella Schlein, LPN   1/61/0960   After Visit Summary: (MyChart) Due to this being a telephonic visit, the after visit summary with patients  personalized plan was offered to patient via MyChart   Nurse Notes: none

## 2023-09-21 DIAGNOSIS — H43813 Vitreous degeneration, bilateral: Secondary | ICD-10-CM | POA: Diagnosis not present

## 2023-09-21 DIAGNOSIS — H2513 Age-related nuclear cataract, bilateral: Secondary | ICD-10-CM | POA: Diagnosis not present

## 2023-11-02 DIAGNOSIS — M0579 Rheumatoid arthritis with rheumatoid factor of multiple sites without organ or systems involvement: Secondary | ICD-10-CM | POA: Diagnosis not present

## 2024-02-09 DIAGNOSIS — Z79899 Other long term (current) drug therapy: Secondary | ICD-10-CM | POA: Diagnosis not present

## 2024-02-09 DIAGNOSIS — M0579 Rheumatoid arthritis with rheumatoid factor of multiple sites without organ or systems involvement: Secondary | ICD-10-CM | POA: Diagnosis not present

## 2024-02-09 DIAGNOSIS — M1991 Primary osteoarthritis, unspecified site: Secondary | ICD-10-CM | POA: Diagnosis not present

## 2024-05-28 ENCOUNTER — Encounter: Payer: Self-pay | Admitting: Radiology

## 2024-08-06 ENCOUNTER — Telehealth: Payer: Self-pay | Admitting: Family

## 2024-08-06 DIAGNOSIS — M069 Rheumatoid arthritis, unspecified: Secondary | ICD-10-CM

## 2024-08-06 DIAGNOSIS — M0579 Rheumatoid arthritis with rheumatoid factor of multiple sites without organ or systems involvement: Secondary | ICD-10-CM

## 2024-08-06 NOTE — Telephone Encounter (Signed)
 Copied from CRM #[id number]. Topic: Referral - Question >> Aug 06, 2024  8:45 AM Logan F wrote: Reason for CRM: Jannis from [facility] calling to have [facility] referral uploaded on the [facility] website. She says the referral also need to be put under Dr Lynwood Ramsay  [phone] phone  [phone] fax

## 2024-08-08 NOTE — Telephone Encounter (Signed)
 Copied from CRM #[id number]. Topic: Referral - Question >> Aug 06, 2024  8:45 AM Logan F wrote: Reason for CRM: Jannis from [facility] Rheumotology calling to have [facility] referral uploaded on the [facility] website. She says the referral also need to be put under Dr Lynwood Ramsay  [phone] phone  [phone] fax >> Aug 08, 2024  1:33 PM Emylou G wrote: They are calling for update.. her appt is tomorrow

## 2024-08-08 NOTE — Telephone Encounter (Signed)
 referral sent

## 2024-08-08 NOTE — Addendum Note (Signed)
 Addended by: Tamber Burtch on: 08/08/2024 05:25 PM   Modules accepted: Orders

## 2024-08-08 NOTE — Telephone Encounter (Signed)
 [place] rheum states provider has to place an insurance referral through [facility] website.  Has to be complete today asap, patient has OV on 1/15 at 3pm.  Dr Lynwood Ramsay NPI : [id number] DX code : M05.79 Minimum 6 months but up to PCP

## 2024-08-15 ENCOUNTER — Telehealth: Payer: Self-pay | Admitting: Family

## 2024-08-15 DIAGNOSIS — M069 Rheumatoid arthritis, unspecified: Secondary | ICD-10-CM

## 2024-08-15 DIAGNOSIS — M0579 Rheumatoid arthritis with rheumatoid factor of multiple sites without organ or systems involvement: Secondary | ICD-10-CM

## 2024-08-15 NOTE — Telephone Encounter (Signed)
 didn't we already do this?

## 2024-08-15 NOTE — Telephone Encounter (Signed)
 Patient sent a mychart message to me requesting a referral to her RA physician. I told her I would forward it to the clinical staff.  Thank you [name] [facility] AWV TEAM Direct Dial [phone]

## 2024-08-16 NOTE — Telephone Encounter (Signed)
 New referral placed for correct location.

## 2024-08-16 NOTE — Addendum Note (Signed)
 Addended by: NEYSA CLARIA SAILOR on: 08/16/2024 09:19 AM   Modules accepted: Orders

## 2024-09-12 ENCOUNTER — Ambulatory Visit

## 2024-09-13 ENCOUNTER — Ambulatory Visit: Payer: Medicare Other
